# Patient Record
Sex: Male | Born: 1965
Health system: Southern US, Community
[De-identification: ages and names within clinical notes are randomized; demographics above are authoritative.]

## PROBLEM LIST (undated history)

## (undated) DIAGNOSIS — R091 Pleurisy: Secondary | ICD-10-CM

## (undated) DIAGNOSIS — J189 Pneumonia, unspecified organism: Secondary | ICD-10-CM

## (undated) DIAGNOSIS — M419 Scoliosis, unspecified: Secondary | ICD-10-CM

## (undated) DIAGNOSIS — I10 Essential (primary) hypertension: Secondary | ICD-10-CM

## (undated) DIAGNOSIS — K219 Gastro-esophageal reflux disease without esophagitis: Secondary | ICD-10-CM

## (undated) DIAGNOSIS — G4730 Sleep apnea, unspecified: Secondary | ICD-10-CM

## (undated) DIAGNOSIS — Z889 Allergy status to unspecified drugs, medicaments and biological substances status: Secondary | ICD-10-CM

## (undated) DIAGNOSIS — R011 Cardiac murmur, unspecified: Secondary | ICD-10-CM

## (undated) HISTORY — PX: LEG SURGERY: SHX1003

## (undated) HISTORY — PX: BACK SURGERY: SHX140

## (undated) HISTORY — PX: MANDIBLE FRACTURE SURGERY: SHX706

## (undated) HISTORY — PX: HERNIA REPAIR: SHX51

## (undated) HISTORY — PX: NASAL SINUS SURGERY: SHX719

---

## 1998-01-21 ENCOUNTER — Other Ambulatory Visit: Admission: RE | Admit: 1998-01-21 | Discharge: 1998-01-21 | Payer: Self-pay | Admitting: Otolaryngology

## 2001-11-14 ENCOUNTER — Ambulatory Visit (HOSPITAL_BASED_OUTPATIENT_CLINIC_OR_DEPARTMENT_OTHER): Admission: RE | Admit: 2001-11-14 | Discharge: 2001-11-14 | Payer: Self-pay | Admitting: Otolaryngology

## 2002-08-27 ENCOUNTER — Ambulatory Visit (HOSPITAL_COMMUNITY): Admission: RE | Admit: 2002-08-27 | Discharge: 2002-08-27 | Payer: Self-pay | Admitting: Family Medicine

## 2002-08-27 ENCOUNTER — Encounter: Payer: Self-pay | Admitting: Family Medicine

## 2003-04-10 ENCOUNTER — Encounter: Payer: Self-pay | Admitting: Family Medicine

## 2003-04-10 ENCOUNTER — Ambulatory Visit (HOSPITAL_COMMUNITY): Admission: RE | Admit: 2003-04-10 | Discharge: 2003-04-10 | Payer: Self-pay | Admitting: Family Medicine

## 2003-12-01 ENCOUNTER — Emergency Department (HOSPITAL_COMMUNITY): Admission: EM | Admit: 2003-12-01 | Discharge: 2003-12-01 | Payer: Self-pay | Admitting: Emergency Medicine

## 2003-12-09 ENCOUNTER — Ambulatory Visit (HOSPITAL_COMMUNITY): Admission: RE | Admit: 2003-12-09 | Discharge: 2003-12-09 | Payer: Self-pay | Admitting: Orthopedic Surgery

## 2003-12-12 ENCOUNTER — Ambulatory Visit (HOSPITAL_COMMUNITY): Admission: RE | Admit: 2003-12-12 | Discharge: 2003-12-12 | Payer: Self-pay | Admitting: Orthopedic Surgery

## 2004-03-17 ENCOUNTER — Ambulatory Visit (HOSPITAL_COMMUNITY): Admission: RE | Admit: 2004-03-17 | Discharge: 2004-03-17 | Payer: Self-pay | Admitting: Family Medicine

## 2004-09-01 ENCOUNTER — Ambulatory Visit (HOSPITAL_COMMUNITY): Admission: RE | Admit: 2004-09-01 | Discharge: 2004-09-01 | Payer: Self-pay | Admitting: Family Medicine

## 2007-02-15 ENCOUNTER — Ambulatory Visit (HOSPITAL_COMMUNITY): Admission: RE | Admit: 2007-02-15 | Discharge: 2007-02-15 | Payer: Self-pay | Admitting: Family Medicine

## 2007-04-18 ENCOUNTER — Emergency Department (HOSPITAL_COMMUNITY): Admission: EM | Admit: 2007-04-18 | Discharge: 2007-04-18 | Payer: Self-pay | Admitting: Emergency Medicine

## 2010-10-26 ENCOUNTER — Ambulatory Visit (HOSPITAL_COMMUNITY)
Admission: RE | Admit: 2010-10-26 | Discharge: 2010-10-26 | Payer: Self-pay | Source: Home / Self Care | Attending: Psychiatry | Admitting: Psychiatry

## 2010-11-08 ENCOUNTER — Encounter: Payer: Self-pay | Admitting: Family Medicine

## 2010-11-13 ENCOUNTER — Encounter
Admission: RE | Admit: 2010-11-13 | Discharge: 2010-11-13 | Payer: Self-pay | Source: Home / Self Care | Attending: Psychiatry | Admitting: Psychiatry

## 2010-12-31 ENCOUNTER — Emergency Department (HOSPITAL_COMMUNITY)
Admission: EM | Admit: 2010-12-31 | Discharge: 2010-12-31 | Disposition: A | Discharge status: Home / Self Care | Payer: Self-pay | Attending: Emergency Medicine | Admitting: Emergency Medicine

## 2010-12-31 ENCOUNTER — Emergency Department (HOSPITAL_COMMUNITY): Payer: Self-pay

## 2010-12-31 DIAGNOSIS — Y92009 Unspecified place in unspecified non-institutional (private) residence as the place of occurrence of the external cause: Secondary | ICD-10-CM | POA: Insufficient documentation

## 2010-12-31 DIAGNOSIS — S61209A Unspecified open wound of unspecified finger without damage to nail, initial encounter: Secondary | ICD-10-CM | POA: Insufficient documentation

## 2010-12-31 DIAGNOSIS — W298XXA Contact with other powered powered hand tools and household machinery, initial encounter: Secondary | ICD-10-CM | POA: Insufficient documentation
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
## 2011-07-07 ENCOUNTER — Ambulatory Visit (HOSPITAL_COMMUNITY)
Admission: RE | Admit: 2011-07-07 | Discharge: 2011-07-07 | Disposition: A | Discharge status: Home / Self Care | Payer: Self-pay | Source: Ambulatory Visit | Attending: Family Medicine | Admitting: Family Medicine

## 2011-07-07 ENCOUNTER — Other Ambulatory Visit (HOSPITAL_COMMUNITY): Payer: Self-pay | Admitting: Family Medicine

## 2011-07-07 DIAGNOSIS — R112 Nausea with vomiting, unspecified: Secondary | ICD-10-CM | POA: Insufficient documentation

## 2011-07-07 DIAGNOSIS — R109 Unspecified abdominal pain: Secondary | ICD-10-CM

## 2011-07-07 DIAGNOSIS — R9389 Abnormal findings on diagnostic imaging of other specified body structures: Secondary | ICD-10-CM | POA: Insufficient documentation

## 2011-08-03 LAB — POCT CARDIAC MARKERS
Myoglobin, poc: 64.6
Operator id: 215201
Troponin i, poc: <0.05

## 2011-08-03 LAB — CBC
HCT: 42.5
Hemoglobin: 14.9
MCHC: 35.1
RDW: 11.4 — ABNORMAL LOW

## 2011-08-03 LAB — DIFFERENTIAL
Lymphocytes Relative: 28
Lymphs Abs: 2.5
Monocytes Relative: 8
Neutro Abs: 5.4
Neutrophils Relative %: 61

## 2011-08-03 LAB — COMPREHENSIVE METABOLIC PANEL
BUN: 26 — ABNORMAL HIGH
Calcium: 9.5
Glucose, Bld: 121 — ABNORMAL HIGH
Sodium: 139
Total Protein: 7.2

## 2014-09-27 ENCOUNTER — Other Ambulatory Visit (HOSPITAL_COMMUNITY): Payer: Self-pay | Admitting: Internal Medicine

## 2014-09-27 DIAGNOSIS — R110 Nausea: Secondary | ICD-10-CM

## 2014-09-27 DIAGNOSIS — R109 Unspecified abdominal pain: Secondary | ICD-10-CM

## 2014-10-03 ENCOUNTER — Ambulatory Visit (HOSPITAL_COMMUNITY): Payer: Self-pay

## 2016-07-29 DIAGNOSIS — F1120 Opioid dependence, uncomplicated: Secondary | ICD-10-CM | POA: Insufficient documentation

## 2019-01-12 DIAGNOSIS — I10 Essential (primary) hypertension: Secondary | ICD-10-CM | POA: Diagnosis not present

## 2019-01-12 DIAGNOSIS — M419 Scoliosis, unspecified: Secondary | ICD-10-CM | POA: Diagnosis not present

## 2019-01-12 DIAGNOSIS — Z1389 Encounter for screening for other disorder: Secondary | ICD-10-CM | POA: Diagnosis not present

## 2019-01-12 DIAGNOSIS — N451 Epididymitis: Secondary | ICD-10-CM | POA: Diagnosis not present

## 2019-01-12 DIAGNOSIS — Z6829 Body mass index (BMI) 29.0-29.9, adult: Secondary | ICD-10-CM | POA: Diagnosis not present

## 2019-01-12 DIAGNOSIS — M549 Dorsalgia, unspecified: Secondary | ICD-10-CM | POA: Diagnosis not present

## 2020-05-16 DIAGNOSIS — I10 Essential (primary) hypertension: Secondary | ICD-10-CM | POA: Diagnosis not present

## 2020-05-16 DIAGNOSIS — R7309 Other abnormal glucose: Secondary | ICD-10-CM | POA: Diagnosis not present

## 2020-05-16 DIAGNOSIS — Z6830 Body mass index (BMI) 30.0-30.9, adult: Secondary | ICD-10-CM | POA: Diagnosis not present

## 2020-05-16 DIAGNOSIS — K4090 Unilateral inguinal hernia, without obstruction or gangrene, not specified as recurrent: Secondary | ICD-10-CM | POA: Diagnosis not present

## 2020-05-16 DIAGNOSIS — Z0001 Encounter for general adult medical examination with abnormal findings: Secondary | ICD-10-CM | POA: Diagnosis not present

## 2020-05-16 DIAGNOSIS — J45909 Unspecified asthma, uncomplicated: Secondary | ICD-10-CM | POA: Diagnosis not present

## 2020-05-16 DIAGNOSIS — Z1389 Encounter for screening for other disorder: Secondary | ICD-10-CM | POA: Diagnosis not present

## 2020-05-16 DIAGNOSIS — F419 Anxiety disorder, unspecified: Secondary | ICD-10-CM | POA: Diagnosis not present

## 2020-05-16 DIAGNOSIS — M419 Scoliosis, unspecified: Secondary | ICD-10-CM | POA: Diagnosis not present

## 2020-05-16 DIAGNOSIS — E6609 Other obesity due to excess calories: Secondary | ICD-10-CM | POA: Diagnosis not present

## 2020-05-16 DIAGNOSIS — K439 Ventral hernia without obstruction or gangrene: Secondary | ICD-10-CM | POA: Diagnosis not present

## 2020-05-19 ENCOUNTER — Other Ambulatory Visit: Payer: Self-pay | Admitting: Family Medicine

## 2020-05-19 ENCOUNTER — Other Ambulatory Visit (HOSPITAL_COMMUNITY): Payer: Self-pay | Admitting: Family Medicine

## 2020-05-19 DIAGNOSIS — N5089 Other specified disorders of the male genital organs: Secondary | ICD-10-CM

## 2020-06-20 ENCOUNTER — Other Ambulatory Visit: Payer: Self-pay

## 2020-06-20 ENCOUNTER — Ambulatory Visit (HOSPITAL_COMMUNITY)
Admission: RE | Admit: 2020-06-20 | Discharge: 2020-06-20 | Disposition: A | Discharge status: Home / Self Care | Payer: BC Managed Care – PPO | Source: Ambulatory Visit | Attending: Family Medicine | Admitting: Family Medicine

## 2020-06-20 DIAGNOSIS — N433 Hydrocele, unspecified: Secondary | ICD-10-CM | POA: Diagnosis not present

## 2020-06-20 DIAGNOSIS — I861 Scrotal varices: Secondary | ICD-10-CM | POA: Diagnosis not present

## 2020-06-20 DIAGNOSIS — N503 Cyst of epididymis: Secondary | ICD-10-CM | POA: Diagnosis not present

## 2020-06-20 DIAGNOSIS — N5089 Other specified disorders of the male genital organs: Secondary | ICD-10-CM | POA: Diagnosis not present

## 2020-07-04 ENCOUNTER — Other Ambulatory Visit: Payer: Self-pay | Admitting: General Surgery

## 2020-07-04 DIAGNOSIS — K432 Incisional hernia without obstruction or gangrene: Secondary | ICD-10-CM

## 2020-07-30 ENCOUNTER — Ambulatory Visit (HOSPITAL_COMMUNITY)
Admission: RE | Admit: 2020-07-30 | Discharge: 2020-07-30 | Disposition: A | Discharge status: Home / Self Care | Payer: BC Managed Care – PPO | Source: Ambulatory Visit | Attending: General Surgery | Admitting: General Surgery

## 2020-07-30 ENCOUNTER — Other Ambulatory Visit: Payer: Self-pay

## 2020-07-30 DIAGNOSIS — N200 Calculus of kidney: Secondary | ICD-10-CM | POA: Diagnosis not present

## 2020-07-30 DIAGNOSIS — K432 Incisional hernia without obstruction or gangrene: Secondary | ICD-10-CM | POA: Insufficient documentation

## 2020-07-30 DIAGNOSIS — S3991XA Unspecified injury of abdomen, initial encounter: Secondary | ICD-10-CM | POA: Diagnosis not present

## 2020-07-30 DIAGNOSIS — K439 Ventral hernia without obstruction or gangrene: Secondary | ICD-10-CM | POA: Diagnosis not present

## 2020-07-30 DIAGNOSIS — K8020 Calculus of gallbladder without cholecystitis without obstruction: Secondary | ICD-10-CM | POA: Diagnosis not present

## 2020-08-01 ENCOUNTER — Ambulatory Visit: Payer: Self-pay | Admitting: General Surgery

## 2020-08-01 DIAGNOSIS — K432 Incisional hernia without obstruction or gangrene: Secondary | ICD-10-CM | POA: Diagnosis not present

## 2020-08-01 NOTE — H&P (Signed)
History of Present Illness Axel Filler MD; 08/01/2020 11:11 AM) The patient is a 54 year old male who presents with an incisional hernia. Patient follow back up after CT scan. CT scan does reveal a 4.6 x 2.3 cm ventral hernia. There appears to be significant has of omentum within the hernia sac itself. Patient states he's had continued burning, stabbing sensation that does radiate to the back for the hernia. I did review the CT scan personally and discussed the CT scan in detail with the patient.     -----------------------------------------  Referred by: Dr. Phillips Odor Chief Complaint: Recurrent ventral hernia  Patient is a 54 year old male with a history of scoliosis with multiple back surgeries, history of hypertension, and a history of multiple recurrent incisional hernias that were fixed in 2000 and 2005. Patient states he is unsure whether or not mesh was used with these hernias.  He's had previous tendon placement in an X fashion over his abdominal wall to help with support his back. This was done in the past.  Patient states that he noticed that the hernia recurred about 2 months ago. He states that he felt that he repeat for this several weeks ago. He states that he was lifting furniture on the initial occurrence.  He has had no signs or symptoms of incarceration or granulation. He does notice some increased gassy symptoms, but is currently on antibiotics for likely epididymitis.    Allergies (Tanisha A. Manson Passey, RMA; 08/01/2020 11:01 AM) Penicillins  Anaphylaxis, Swelling. Allergies Reconciled   Medication History (Tanisha A. Brown, RMA; 08/01/2020 11:01 AM) Robaxin-750 (750MG  Tablet, 1 (one) Oral qhs, Taken starting 07/04/2020) Active. oxyCODONE-Acetaminophen (10-325MG  Tablet, Oral) Active. NexIUM (40MG  Capsule DR, Oral) Active. Allegra (30MG  Tablet, Oral) Active. Albuterol Sulfate HFA (108 (90 Base)MCG/ACT Aerosol Soln, Inhalation) Active. Medications  Reconciled    Review of Systems 07/06/2020, MD; 08/01/2020 11:13 AM) General Not Present- Appetite Loss, Chills, Fatigue, Fever, Night Sweats, Weight Gain and Weight Loss. Skin Not Present- Change in Wart/Mole, Dryness, Hives, Jaundice, New Lesions, Non-Healing Wounds, Rash and Ulcer. HEENT Present- Ringing in the Ears, Seasonal Allergies, Sinus Pain and Wears glasses/contact lenses. Not Present- Earache, Hearing Loss, Hoarseness, Nose Bleed, Oral Ulcers, Sore Throat, Visual Disturbances and Yellow Eyes. Respiratory Present- Snoring. Not Present- Bloody sputum, Chronic Cough, Difficulty Breathing and Wheezing. Breast Not Present- Breast Mass, Breast Pain, Nipple Discharge and Skin Changes. Cardiovascular Present- Leg Cramps. Not Present- Chest Pain, Difficulty Breathing Lying Down, Palpitations, Rapid Heart Rate, Shortness of Breath and Swelling of Extremities. Gastrointestinal Present- Abdominal Pain, Constipation and Indigestion. Not Present- Bloating, Bloody Stool, Change in Bowel Habits, Chronic diarrhea, Difficulty Swallowing, Excessive gas, Gets full quickly at meals, Hemorrhoids, Nausea, Rectal Pain and Vomiting. Male Genitourinary Present- Change in Urinary Stream and Nocturia. Not Present- Blood in Urine, Frequency, Impotence, Painful Urination, Urgency and Urine Leakage. Musculoskeletal Present- Back Pain. Not Present- Joint Pain, Joint Stiffness, Muscle Pain, Muscle Weakness and Swelling of Extremities. Neurological Not Present- Decreased Memory, Fainting, Headaches, Numbness, Seizures, Tingling, Tremor, Trouble walking and Weakness. Psychiatric Not Present- Anxiety, Bipolar, Change in Sleep Pattern, Depression, Fearful and Frequent crying. Endocrine Not Present- Cold Intolerance, Excessive Hunger, Hair Changes, Heat Intolerance and New Diabetes. Hematology Not Present- Blood Thinners, Easy Bruising, Excessive bleeding, Gland problems, HIV and Persistent Infections. All other  systems negative  Vitals (Tanisha A. Brown RMA; 08/01/2020 11:01 AM) 08/01/2020 11:01 AM Weight: 191.8 lb Height: 65in Body Surface Area: 1.94 m Body Mass Index: 31.92 kg/m  Temp.: 97.20F  Pulse:  83 (Regular)  BP: 134/82(Sitting, Left Arm, Standard)       Physical Exam Axel Filler, MD; 08/01/2020 11:13 AM) The physical exam findings are as follows: Note: Constitutional: No acute distress, conversant, appears stated age  Eyes: Anicteric sclerae, moist conjunctiva, no lid lag  Neck: No thyromegaly, trachea midline, no cervical lymphadenopathy  Lungs: Clear to auscultation biilaterally, normal respiratory effot  Cardiovascular: regular rate & rhythm, no murmurs, no peripheal edema, pedal pulses 2+  GI: Soft, no masses or hepatosplenomegaly, non-tender to palpation  MSK: Normal gait, no clubbing cyanosis, edema  Skin: No rashes, palpation reveals normal skin turgor  Psychiatric: Appropriate judgment and insight, oriented to person, place, and time  Abdomen Inspection Hernias - Incisional - Reducible (Recurrent incisional hernia in the upper midline. Multiple incisions in the left upper left lower lateral quadrant, right upper right lower lateral quadrant. Patient has an incision just over the umbilicus in a transverse fashion.) .    Assessment & Plan Axel Filler MD; 08/01/2020 11:12 AM) RECURRENT INCISIONAL HERNIA (K43.2) Impression: 54 year old male with recurrent incisional hernia, this is complicated with his history of previous hernia repair tests will scoliosis and surgeries for scoliosis. 1. The patient will like to proceed to the operating room for laparoscopic ventral hernia repair with mesh.  2. I discussed with the patient the signs and symptoms of incarceration and strangulation and the need to proceed to the ER should they occur.  3. I discussed with the patient the risks and benefits of the procedure to include but not limited to:  Infection, bleeding, damage to surrounding structures, possible need for further surgery, possible nerve pain, and possible recurrence. The patient was understanding and wishes to proceed.   I reviewed the patient's external notes from the referring physicians as well as consulting physician team. Each of the radiologic studies and lab studies were independently reviewed and interpreted. I discussed the results of the above studies and how they relate to the patient's surgical problems.

## 2020-09-29 ENCOUNTER — Inpatient Hospital Stay (HOSPITAL_COMMUNITY)
Admission: RE | Admit: 2020-09-29 | Discharge: 2020-09-29 | Disposition: A | Discharge status: Home / Self Care | Payer: BC Managed Care – PPO | Source: Ambulatory Visit

## 2020-09-29 ENCOUNTER — Other Ambulatory Visit (HOSPITAL_COMMUNITY): Payer: BC Managed Care – PPO

## 2020-09-29 NOTE — Progress Notes (Signed)
Your procedure is scheduled on Thursday October 02, 2020.  Report to [facility] Main Entrance "A" at 08:45 A.M., and check in at the Admitting office.  Call this number if you have problems the morning of surgery: [phone]  Call [phone] if you have any questions prior to your surgery date Monday-Friday 8am-4pm   Remember: Do not eat after midnight the night before your surgery  You may drink clear liquids until 07:45 A.M. the morning of your surgery.   Clear liquids allowed are: Water, Non-Citrus Juices (without pulp), Carbonated Beverages, Clear Tea, Black Coffee Only, and Gatorade   Take these medicines the morning of surgery with A SIP OF WATER: esomeprazole (NEXIUM)  montelukast (SINGULAIR)    If needed: albuterol (VENTOLIN HFA) --- Please bring all inhalers with you the day of surgery.  eye drops   As of today, STOP taking any Aspirin (unless otherwise instructed by your surgeon), Aleve, Naproxen, Ibuprofen, Motrin, Advil, Goody's, BC's, all herbal medications, fish oil, and all vitamins.    The Morning of Surgery  Do not wear jewelr  Do not wear lotions, powders, colognes, or deodorant Men may shave face and neck.  Do not bring valuables to the hospital.  [facility] is not responsible for any belongings or valuables.  If you are a smoker, DO NOT Smoke 24 hours prior to surgery  If you wear a CPAP at night please bring your mask the morning of surgery   Remember that you must have someone to transport you home after your surgery, and remain with you for 24 hours if you are discharged the same day.   Please bring cases for contacts, glasses, hearing aids, dentures or bridgework because it cannot be worn into surgery.    Leave your suitcase in the car.  After surgery it may be brought to your room.  For patients admitted to the hospital, discharge time will be determined by your treatment team.  Patients discharged the day of surgery will not be  allowed to drive home.    Special instructions:   [facility]- Preparing For Surgery  Before surgery, you can play an important role. Because skin is not sterile, your skin needs to be as free of germs as possible. You can reduce the number of germs on your skin by washing with CHG (chlorahexidine gluconate) Soap before surgery.  CHG is an antiseptic cleaner which kills germs and bonds with the skin to continue killing germs even after washing.    Oral Hygiene is also important to reduce your risk of infection.  Remember - BRUSH YOUR TEETH THE MORNING OF SURGERY WITH YOUR REGULAR TOOTHPASTE  Please do not use if you have an allergy to CHG or antibacterial soaps. If your skin becomes reddened/irritated stop using the CHG.  Do not shave (including legs and underarms) for at least 48 hours prior to first CHG shower. It is OK to shave your face.  Please follow these instructions carefully.   1. Shower the NIGHT BEFORE SURGERY and the MORNING OF SURGERY with CHG Soap.   2. If you chose to wash your hair and body, wash as usual with your normal shampoo and body-wash/soap.  3. Rinse your hair and body thoroughly to remove the shampoo and soap.  4. Apply CHG directly to the skin (ONLY FROM THE NECK DOWN) and wash gently with a scrungie or a clean washcloth.   5. Do not use on open wounds or open sores. Avoid contact with your eyes,  ears, mouth and genitals (private parts). Wash Face and genitals (private parts)  with your normal soap.   6. Wash thoroughly, paying special attention to the area where your surgery will be performed.  7. Thoroughly rinse your body with warm water from the neck down.  8. DO NOT shower/wash with your normal soap after using and rinsing off the CHG Soap.  9. Pat yourself dry with a CLEAN TOWEL.  10. Wear CLEAN PAJAMAS to bed the night before surgery  11. Place CLEAN SHEETS on your bed the night of your first shower and DO NOT SLEEP WITH PETS.  12. Wear  comfortable clothes the morning of surgery.     Day of Surgery:  Please shower the morning of surgery with the CHG soap Do not apply any deodorants/lotions. Please wear clean clothes to the hospital/surgery center.   Remember to brush your teeth WITH YOUR REGULAR TOOTHPASTE.   Please read over the following fact sheets that you were given.

## 2020-09-30 ENCOUNTER — Other Ambulatory Visit: Payer: Self-pay

## 2020-09-30 ENCOUNTER — Encounter (HOSPITAL_COMMUNITY): Payer: Self-pay

## 2020-09-30 ENCOUNTER — Other Ambulatory Visit (HOSPITAL_COMMUNITY)
Admission: RE | Admit: 2020-09-30 | Discharge: 2020-09-30 | Disposition: A | Discharge status: Home / Self Care | Payer: BC Managed Care – PPO | Source: Ambulatory Visit | Attending: General Surgery | Admitting: General Surgery

## 2020-09-30 ENCOUNTER — Encounter (HOSPITAL_COMMUNITY)
Admission: RE | Admit: 2020-09-30 | Discharge: 2020-09-30 | Disposition: A | Discharge status: Home / Self Care | Payer: BC Managed Care – PPO | Source: Ambulatory Visit | Attending: General Surgery | Admitting: General Surgery

## 2020-09-30 DIAGNOSIS — Z01812 Encounter for preprocedural laboratory examination: Secondary | ICD-10-CM | POA: Insufficient documentation

## 2020-09-30 DIAGNOSIS — Z01818 Encounter for other preprocedural examination: Secondary | ICD-10-CM | POA: Diagnosis not present

## 2020-09-30 DIAGNOSIS — Z20822 Contact with and (suspected) exposure to covid-19: Secondary | ICD-10-CM | POA: Diagnosis not present

## 2020-09-30 HISTORY — DX: Scoliosis, unspecified: M41.9

## 2020-09-30 HISTORY — DX: Allergy status to unspecified drugs, medicaments and biological substances: Z88.9

## 2020-09-30 HISTORY — DX: Sleep apnea, unspecified: G47.30

## 2020-09-30 HISTORY — DX: Pleurisy: R09.1

## 2020-09-30 HISTORY — DX: Essential (primary) hypertension: I10

## 2020-09-30 HISTORY — DX: Gastro-esophageal reflux disease without esophagitis: K21.9

## 2020-09-30 HISTORY — DX: Pneumonia, unspecified organism: J18.9

## 2020-09-30 HISTORY — DX: Cardiac murmur, unspecified: R01.1

## 2020-09-30 LAB — CBC
HCT: 49.9 % (ref 39.0–52.0)
Hemoglobin: 16 g/dL (ref 13.0–17.0)
MCH: 31.4 pg (ref 26.0–34.0)
MCHC: 32.1 g/dL (ref 30.0–36.0)
MCV: 97.8 fL (ref 80.0–100.0)
Platelets: 274 10*3/uL (ref 150–400)
RBC: 5.1 MIL/uL (ref 4.22–5.81)
RDW: 12.3 % (ref 11.5–15.5)
WBC: 5.5 10*3/uL (ref 4.0–10.5)
nRBC: 0 % (ref 0.0–0.2)

## 2020-09-30 LAB — BASIC METABOLIC PANEL
Anion gap: 8 (ref 5–15)
BUN: 12 mg/dL (ref 6–20)
CO2: 32 mmol/L (ref 22–32)
Calcium: 9.3 mg/dL (ref 8.9–10.3)
Chloride: 99 mmol/L (ref 98–111)
Creatinine, Ser: 1.21 mg/dL (ref 0.61–1.24)
GFR, Estimated: >60 mL/min (ref >60)
Glucose, Bld: 94 mg/dL (ref 70–99)
Potassium: 4.9 mmol/L (ref 3.5–5.1)
Sodium: 139 mmol/L (ref 135–145)

## 2020-09-30 LAB — SARS CORONAVIRUS 2 (TAT 6-24 HRS): SARS Coronavirus 2: NEGATIVE

## 2020-09-30 NOTE — Progress Notes (Signed)
PCP - Dr. Karleen Hampshire Cardiologist -   PPM/ICD -  Device Orders -  Rep Notified -   Chest x-ray -  EKG - 09/30/2020 Stress Test - patient states it was more than 10 years ago, thinks it was at [facility] but is unsure - states it was stress related and all cleared up after leaving his job at the time  ECHO - patient denies Cardiac Cath - patient denies  Sleep Study - patient states it was 10+ years ago, "a long time ago", not sure where CPAP - tried CPAP but it did not help, instead uses a mouth guard.  Fasting Blood Sugar - n/a Checks Blood Sugar _____ times a day  Blood Thinner Instructions: n/a Aspirin Instructions: n/a  ERAS Protcol - clears until 0745 PRE-SURGERY Ensure or G2- none ordered  COVID TEST- after PAT appointment   Anesthesia review: n/a  Patient denies shortness of breath, fever, cough and chest pain at PAT appointment   All instructions explained to the patient, with a verbal understanding of the material. Patient agrees to go over the instructions while at home for a better understanding. Patient also instructed to self quarantine after being tested for COVID-19. The opportunity to ask questions was provided.

## 2020-10-02 ENCOUNTER — Encounter (HOSPITAL_COMMUNITY)
Admission: RE | Disposition: A | Discharge status: Home / Self Care | Payer: Self-pay | Source: Home / Self Care | Attending: General Surgery

## 2020-10-02 ENCOUNTER — Ambulatory Visit (HOSPITAL_COMMUNITY): Payer: BC Managed Care – PPO | Admitting: Anesthesiology

## 2020-10-02 ENCOUNTER — Observation Stay (HOSPITAL_COMMUNITY)
Admission: RE | Admit: 2020-10-02 | Discharge: 2020-10-03 | Disposition: A | Discharge status: Home / Self Care | Payer: BC Managed Care – PPO | Attending: General Surgery | Admitting: General Surgery

## 2020-10-02 ENCOUNTER — Other Ambulatory Visit: Payer: Self-pay

## 2020-10-02 ENCOUNTER — Encounter (HOSPITAL_COMMUNITY): Payer: Self-pay | Admitting: General Surgery

## 2020-10-02 DIAGNOSIS — K432 Incisional hernia without obstruction or gangrene: Principal | ICD-10-CM | POA: Insufficient documentation

## 2020-10-02 DIAGNOSIS — I10 Essential (primary) hypertension: Secondary | ICD-10-CM | POA: Diagnosis not present

## 2020-10-02 DIAGNOSIS — Z8719 Personal history of other diseases of the digestive system: Secondary | ICD-10-CM

## 2020-10-02 DIAGNOSIS — K219 Gastro-esophageal reflux disease without esophagitis: Secondary | ICD-10-CM | POA: Diagnosis not present

## 2020-10-02 DIAGNOSIS — G4730 Sleep apnea, unspecified: Secondary | ICD-10-CM | POA: Diagnosis not present

## 2020-10-02 DIAGNOSIS — K439 Ventral hernia without obstruction or gangrene: Secondary | ICD-10-CM | POA: Diagnosis not present

## 2020-10-02 HISTORY — PX: INSERTION OF MESH: SHX5868

## 2020-10-02 HISTORY — PX: VENTRAL HERNIA REPAIR: SHX424

## 2020-10-02 LAB — CREATININE, SERUM
Creatinine, Ser: 1.28 mg/dL — ABNORMAL HIGH (ref 0.61–1.24)
GFR, Estimated: >60 mL/min (ref >60)

## 2020-10-02 SURGERY — REPAIR, HERNIA, VENTRAL, LAPAROSCOPIC
Anesthesia: General | Site: Abdomen

## 2020-10-02 MED ORDER — 0.9 % SODIUM CHLORIDE (POUR BTL) OPTIME
TOPICAL | Status: DC | PRN
  Administered 2020-10-02: 10:00:00 1000 mL

## 2020-10-02 MED ORDER — MIDAZOLAM HCL 2 MG/2ML IJ SOLN: 1.0000 mg | Freq: Once | INTRAMUSCULAR | Status: AC

## 2020-10-02 MED ORDER — DIAZEPAM 5 MG PO TABS
ORAL_TABLET | ORAL | Status: AC
  Administered 2020-10-02: 11:00:00 5 mg via ORAL
  Filled 2020-10-02: qty 1

## 2020-10-02 MED ORDER — FENTANYL CITRATE (PF) 100 MCG/2ML IJ SOLN
INTRAMUSCULAR | Status: AC
  Administered 2020-10-02: 12:00:00 50 ug via INTRAVENOUS
  Filled 2020-10-02: qty 2

## 2020-10-02 MED ORDER — MIDAZOLAM HCL 2 MG/2ML IJ SOLN
INTRAMUSCULAR | Status: AC
  Filled 2020-10-02: qty 2

## 2020-10-02 MED ORDER — ROCURONIUM BROMIDE 10 MG/ML (PF) SYRINGE
PREFILLED_SYRINGE | INTRAVENOUS | Status: AC
  Filled 2020-10-02: qty 10

## 2020-10-02 MED ORDER — PANTOPRAZOLE SODIUM 40 MG PO TBEC
40.0000 mg | DELAYED_RELEASE_TABLET | Freq: Every day | ORAL | Status: DC
  Administered 2020-10-03: 09:00:00 40 mg via ORAL
  Filled 2020-10-02: qty 1

## 2020-10-02 MED ORDER — TETRAHYDROZOLINE HCL 0.05 % OP SOLN
2.0000 [drp] | Freq: Every day | OPHTHALMIC | Status: DC | PRN
  Filled 2020-10-02: qty 15

## 2020-10-02 MED ORDER — SUGAMMADEX SODIUM 200 MG/2ML IV SOLN
INTRAVENOUS | Status: DC | PRN
  Administered 2020-10-02: 200 mg via INTRAVENOUS

## 2020-10-02 MED ORDER — MIDAZOLAM HCL 2 MG/2ML IJ SOLN
1.0000 mg | Freq: Once | INTRAMUSCULAR | Status: AC
  Administered 2020-10-02: 11:00:00 1 mg via INTRAVENOUS

## 2020-10-02 MED ORDER — ENOXAPARIN SODIUM 40 MG/0.4ML ~~LOC~~ SOLN
40.0000 mg | SUBCUTANEOUS | Status: DC
  Administered 2020-10-03: 08:00:00 40 mg via SUBCUTANEOUS
  Filled 2020-10-02: qty 0.4

## 2020-10-02 MED ORDER — OXYMETAZOLINE HCL 0.05 % NA SOLN
1.0000 | Freq: Two times a day (BID) | NASAL | Status: DC | PRN
  Filled 2020-10-02 (×2): qty 30

## 2020-10-02 MED ORDER — ALBUTEROL SULFATE HFA 108 (90 BASE) MCG/ACT IN AERS: 2.0000 | INHALATION_SPRAY | Freq: Four times a day (QID) | RESPIRATORY_TRACT | Status: DC | PRN

## 2020-10-02 MED ORDER — ONDANSETRON HCL 4 MG/2ML IJ SOLN: 4.0000 mg | Freq: Four times a day (QID) | INTRAMUSCULAR | Status: DC | PRN

## 2020-10-02 MED ORDER — LIDOCAINE 2% (20 MG/ML) 5 ML SYRINGE
INTRAMUSCULAR | Status: AC
  Filled 2020-10-02: qty 5

## 2020-10-02 MED ORDER — FENTANYL CITRATE (PF) 250 MCG/5ML IJ SOLN
INTRAMUSCULAR | Status: DC | PRN
  Administered 2020-10-02 (×3): 50 ug via INTRAVENOUS
  Administered 2020-10-02: 150 ug via INTRAVENOUS
  Administered 2020-10-02 (×2): 50 ug via INTRAVENOUS

## 2020-10-02 MED ORDER — MIDAZOLAM HCL 2 MG/2ML IJ SOLN
INTRAMUSCULAR | Status: AC
  Administered 2020-10-02: 12:00:00 1 mg via INTRAVENOUS
  Filled 2020-10-02: qty 2

## 2020-10-02 MED ORDER — OXYCODONE HCL 5 MG PO TABS
10.0000 mg | ORAL_TABLET | ORAL | Status: DC | PRN
  Administered 2020-10-02 – 2020-10-03 (×3): 10 mg via ORAL
  Filled 2020-10-02 (×3): qty 2

## 2020-10-02 MED ORDER — ROCURONIUM BROMIDE 10 MG/ML (PF) SYRINGE
PREFILLED_SYRINGE | INTRAVENOUS | Status: DC | PRN
  Administered 2020-10-02: 60 mg via INTRAVENOUS

## 2020-10-02 MED ORDER — ONDANSETRON 4 MG PO TBDP: 4.0000 mg | ORAL_TABLET | Freq: Four times a day (QID) | ORAL | Status: DC | PRN

## 2020-10-02 MED ORDER — ORAL CARE MOUTH RINSE: 15.0000 mL | Freq: Once | OROMUCOSAL | Status: AC

## 2020-10-02 MED ORDER — FENTANYL CITRATE (PF) 250 MCG/5ML IJ SOLN
INTRAMUSCULAR | Status: AC
  Filled 2020-10-02: qty 5

## 2020-10-02 MED ORDER — FENTANYL CITRATE (PF) 100 MCG/2ML IJ SOLN
INTRAMUSCULAR | Status: AC
  Administered 2020-10-02: 11:00:00 50 ug via INTRAVENOUS
  Filled 2020-10-02: qty 2

## 2020-10-02 MED ORDER — PROMETHAZINE HCL 25 MG/ML IJ SOLN: 6.2500 mg | INTRAMUSCULAR | Status: DC | PRN

## 2020-10-02 MED ORDER — OXYCODONE HCL 5 MG PO TABS
ORAL_TABLET | ORAL | Status: AC
  Administered 2020-10-02: 15:00:00 10 mg via ORAL
  Filled 2020-10-02: qty 2

## 2020-10-02 MED ORDER — ONDANSETRON HCL 4 MG/2ML IJ SOLN
INTRAMUSCULAR | Status: DC | PRN
  Administered 2020-10-02: 4 mg via INTRAVENOUS

## 2020-10-02 MED ORDER — PROPOFOL 10 MG/ML IV BOLUS
INTRAVENOUS | Status: AC
  Filled 2020-10-02: qty 20

## 2020-10-02 MED ORDER — CHLORHEXIDINE GLUCONATE 0.12 % MT SOLN: 15.0000 mL | Freq: Once | OROMUCOSAL | Status: AC

## 2020-10-02 MED ORDER — LACTATED RINGERS IV SOLN: INTRAVENOUS | Status: DC

## 2020-10-02 MED ORDER — LIDOCAINE 2% (20 MG/ML) 5 ML SYRINGE
INTRAMUSCULAR | Status: DC | PRN
  Administered 2020-10-02: 40 mg via INTRAVENOUS

## 2020-10-02 MED ORDER — DIAZEPAM 5 MG PO TABS
5.0000 mg | ORAL_TABLET | Freq: Four times a day (QID) | ORAL | Status: DC | PRN
  Administered 2020-10-02 – 2020-10-03 (×3): 5 mg via ORAL
  Filled 2020-10-02 (×3): qty 1

## 2020-10-02 MED ORDER — CHLORHEXIDINE GLUCONATE CLOTH 2 % EX PADS: 6.0000 | MEDICATED_PAD | Freq: Once | CUTANEOUS | Status: DC

## 2020-10-02 MED ORDER — ONDANSETRON HCL 4 MG/2ML IJ SOLN
INTRAMUSCULAR | Status: AC
  Filled 2020-10-02: qty 2

## 2020-10-02 MED ORDER — DIAZEPAM 5 MG PO TABS: 5.0000 mg | ORAL_TABLET | Freq: Once | ORAL | Status: AC

## 2020-10-02 MED ORDER — PROPOFOL 10 MG/ML IV BOLUS
INTRAVENOUS | Status: DC | PRN
  Administered 2020-10-02: 200 mg via INTRAVENOUS

## 2020-10-02 MED ORDER — DEXMEDETOMIDINE (PRECEDEX) IN NS 20 MCG/5ML (4 MCG/ML) IV SYRINGE
PREFILLED_SYRINGE | INTRAVENOUS | Status: DC | PRN
  Administered 2020-10-02: 8 ug via INTRAVENOUS
  Administered 2020-10-02: 12 ug via INTRAVENOUS

## 2020-10-02 MED ORDER — DEXAMETHASONE SODIUM PHOSPHATE 10 MG/ML IJ SOLN
INTRAMUSCULAR | Status: DC | PRN
  Administered 2020-10-02: 10 mg via INTRAVENOUS

## 2020-10-02 MED ORDER — HYDROMORPHONE HCL 1 MG/ML IJ SOLN: 0.5000 mg | Freq: Once | INTRAMUSCULAR | Status: AC

## 2020-10-02 MED ORDER — DEXAMETHASONE SODIUM PHOSPHATE 10 MG/ML IJ SOLN
INTRAMUSCULAR | Status: AC
  Filled 2020-10-02: qty 1

## 2020-10-02 MED ORDER — CHLORHEXIDINE GLUCONATE 0.12 % MT SOLN
OROMUCOSAL | Status: AC
  Administered 2020-10-02: 09:00:00 15 mL via OROMUCOSAL
  Filled 2020-10-02: qty 15

## 2020-10-02 MED ORDER — VANCOMYCIN HCL IN DEXTROSE 1-5 GM/200ML-% IV SOLN
1000.0000 mg | Freq: Once | INTRAVENOUS | Status: AC
  Administered 2020-10-02: 10:00:00 1000 mg via INTRAVENOUS
  Filled 2020-10-02: qty 200

## 2020-10-02 MED ORDER — ACETAMINOPHEN 500 MG PO TABS
ORAL_TABLET | ORAL | Status: AC
  Administered 2020-10-02: 09:00:00 1000 mg via ORAL
  Filled 2020-10-02: qty 2

## 2020-10-02 MED ORDER — TRAMADOL HCL 50 MG PO TABS: 50.0000 mg | ORAL_TABLET | Freq: Four times a day (QID) | ORAL | 0 refills | Status: AC | PRN

## 2020-10-02 MED ORDER — DIAZEPAM 5 MG PO TABS
ORAL_TABLET | ORAL | Status: AC
  Administered 2020-10-03: 11:00:00 5 mg via ORAL
  Filled 2020-10-02: qty 1

## 2020-10-02 MED ORDER — METHOCARBAMOL 1000 MG/10ML IJ SOLN
500.0000 mg | INTRAVENOUS | Status: AC
  Administered 2020-10-02: 13:00:00 500 mg via INTRAVENOUS
  Filled 2020-10-02 (×2): qty 5

## 2020-10-02 MED ORDER — MONTELUKAST SODIUM 10 MG PO TABS
10.0000 mg | ORAL_TABLET | Freq: Every day | ORAL | Status: DC
  Administered 2020-10-03: 09:00:00 10 mg via ORAL
  Filled 2020-10-02: qty 1

## 2020-10-02 MED ORDER — BUPIVACAINE HCL 0.25 % IJ SOLN
INTRAMUSCULAR | Status: DC | PRN
  Administered 2020-10-02: 6 mL

## 2020-10-02 MED ORDER — MIDAZOLAM HCL 5 MG/5ML IJ SOLN
INTRAMUSCULAR | Status: DC | PRN
  Administered 2020-10-02: 2 mg via INTRAVENOUS

## 2020-10-02 MED ORDER — HYDROMORPHONE HCL 1 MG/ML IJ SOLN
INTRAMUSCULAR | Status: AC
  Administered 2020-10-02: 12:00:00 0.5 mg via INTRAVENOUS
  Filled 2020-10-02: qty 1

## 2020-10-02 MED ORDER — DIAZEPAM 5 MG PO TABS: 5.0000 mg | ORAL_TABLET | Freq: Four times a day (QID) | ORAL | Status: DC | PRN

## 2020-10-02 MED ORDER — BUPIVACAINE HCL (PF) 0.25 % IJ SOLN
INTRAMUSCULAR | Status: AC
  Filled 2020-10-02: qty 30

## 2020-10-02 MED ORDER — VANCOMYCIN HCL 1000 MG IV SOLR: INTRAVENOUS | Status: DC | PRN

## 2020-10-02 MED ORDER — LISINOPRIL 20 MG PO TABS
20.0000 mg | ORAL_TABLET | Freq: Every day | ORAL | Status: DC
  Administered 2020-10-03: 09:00:00 20 mg via ORAL
  Filled 2020-10-02: qty 1

## 2020-10-02 MED ORDER — ADULT MULTIVITAMIN W/MINERALS CH
1.0000 | ORAL_TABLET | Freq: Every day | ORAL | Status: DC
  Administered 2020-10-03: 09:00:00 1 via ORAL
  Filled 2020-10-02: qty 1

## 2020-10-02 MED ORDER — FENTANYL CITRATE (PF) 100 MCG/2ML IJ SOLN
25.0000 ug | INTRAMUSCULAR | Status: DC | PRN
  Administered 2020-10-02: 50 ug via INTRAVENOUS

## 2020-10-02 MED ORDER — GABAPENTIN 300 MG PO CAPS: 300.0000 mg | ORAL_CAPSULE | ORAL | Status: AC

## 2020-10-02 MED ORDER — HYDROMORPHONE HCL 1 MG/ML IJ SOLN
INTRAMUSCULAR | Status: AC
  Administered 2020-10-02: 13:00:00 0.5 mg via INTRAVENOUS
  Filled 2020-10-02: qty 1

## 2020-10-02 MED ORDER — GUAIFENESIN ER 600 MG PO TB12
1200.0000 mg | ORAL_TABLET | Freq: Three times a day (TID) | ORAL | Status: DC | PRN
  Administered 2020-10-02: 23:00:00 1200 mg via ORAL
  Filled 2020-10-02: qty 2

## 2020-10-02 MED ORDER — ACETAMINOPHEN 500 MG PO TABS: 1000.0000 mg | ORAL_TABLET | ORAL | Status: AC

## 2020-10-02 MED ORDER — GABAPENTIN 300 MG PO CAPS
ORAL_CAPSULE | ORAL | Status: AC
  Administered 2020-10-02: 09:00:00 300 mg via ORAL
  Filled 2020-10-02: qty 1

## 2020-10-02 SURGICAL SUPPLY — 43 items
APPLIER CLIP 5 13 M/L LIGAMAX5 (MISCELLANEOUS)
CANISTER SUCT 3000ML PPV (MISCELLANEOUS) IMPLANT
CHLORAPREP W/TINT 26 (MISCELLANEOUS) ×3 IMPLANT
CLIP APPLIE 5 13 M/L LIGAMAX5 (MISCELLANEOUS) IMPLANT
COVER SURGICAL LIGHT HANDLE (MISCELLANEOUS) ×3 IMPLANT
COVER WAND RF STERILE (DRAPES) IMPLANT
DERMABOND ADVANCED (GAUZE/BANDAGES/DRESSINGS) ×2
DERMABOND ADVANCED .7 DNX12 (GAUZE/BANDAGES/DRESSINGS) ×1 IMPLANT
DEVICE SECURE STRAP 25 ABSORB (INSTRUMENTS) ×6 IMPLANT
DEVICE TROCAR PUNCTURE CLOSURE (ENDOMECHANICALS) ×3 IMPLANT
ELECT REM PT RETURN 9FT ADLT (ELECTROSURGICAL) ×3
ELECTRODE REM PT RTRN 9FT ADLT (ELECTROSURGICAL) ×1 IMPLANT
GLOVE BIO SURGEON STRL SZ7.5 (GLOVE) ×3 IMPLANT
GOWN STRL REUS W/ TWL LRG LVL3 (GOWN DISPOSABLE) ×2 IMPLANT
GOWN STRL REUS W/ TWL XL LVL3 (GOWN DISPOSABLE) ×1 IMPLANT
GOWN STRL REUS W/TWL LRG LVL3 (GOWN DISPOSABLE) ×6
GOWN STRL REUS W/TWL XL LVL3 (GOWN DISPOSABLE) ×3
KIT BASIN OR (CUSTOM PROCEDURE TRAY) ×3 IMPLANT
KIT TURNOVER KIT B (KITS) ×3 IMPLANT
MESH VENTRALIGHT ST 6X8 (Mesh Specialty) ×3 IMPLANT
MESH VENTRLGHT ELLIPSE 8X6XMFL (Mesh Specialty) ×1 IMPLANT
NEEDLE INSUFFLATION 14GA 120MM (NEEDLE) ×3 IMPLANT
NS IRRIG 1000ML POUR BTL (IV SOLUTION) ×3 IMPLANT
PAD ARMBOARD 7.5X6 YLW CONV (MISCELLANEOUS) ×6 IMPLANT
POUCH LAPAROSCOPIC INSTRUMENT (MISCELLANEOUS) ×3 IMPLANT
SCISSORS LAP 5X35 DISP (ENDOMECHANICALS) ×3 IMPLANT
SET IRRIG TUBING LAPAROSCOPIC (IRRIGATION / IRRIGATOR) IMPLANT
SET TUBE SMOKE EVAC HIGH FLOW (TUBING) ×3 IMPLANT
SHEARS HARMONIC ACE PLUS 36CM (ENDOMECHANICALS) IMPLANT
SLEEVE ENDOPATH XCEL 5M (ENDOMECHANICALS) ×6 IMPLANT
SUT CHROMIC 2 0 SH (SUTURE) ×3 IMPLANT
SUT MNCRL AB 4-0 PS2 18 (SUTURE) ×3 IMPLANT
SUT NOVA 1 T20/GS 25DT (SUTURE) ×3 IMPLANT
SUT PROLENE 2 0 KS (SUTURE) ×6 IMPLANT
TOWEL GREEN STERILE (TOWEL DISPOSABLE) ×3 IMPLANT
TOWEL GREEN STERILE FF (TOWEL DISPOSABLE) ×3 IMPLANT
TRAY FOLEY W/BAG SLVR 16FR (SET/KITS/TRAYS/PACK)
TRAY FOLEY W/BAG SLVR 16FR ST (SET/KITS/TRAYS/PACK) IMPLANT
TRAY LAPAROSCOPIC MC (CUSTOM PROCEDURE TRAY) ×3 IMPLANT
TROCAR XCEL NON-BLD 11X100MML (ENDOMECHANICALS) IMPLANT
TROCAR XCEL NON-BLD 5MMX100MML (ENDOMECHANICALS) ×3 IMPLANT
WARMER LAPAROSCOPE (MISCELLANEOUS) ×3 IMPLANT
WATER STERILE IRR 1000ML POUR (IV SOLUTION) ×3 IMPLANT

## 2020-10-02 NOTE — Discharge Instructions (Signed)
[facility] _______[facility] ° °HERNIA REPAIR: POST OP INSTRUCTIONS ° °Always review your discharge instruction sheet given to you by the facility where your surgery was performed. °IF YOU HAVE DISABILITY OR FAMILY LEAVE FORMS, YOU MUST BRING THEM TO THE OFFICE FOR PROCESSING.   °DO NOT GIVE THEM TO YOUR DOCTOR. ° °1. A  prescription for pain medication may be given to you upon discharge.  Take your pain medication as prescribed, if needed.  If narcotic pain medicine is not needed, then you may take acetaminophen (Tylenol) or ibuprofen (Advil) as needed. °2. Take your usually prescribed medications unless otherwise directed. °If you need a refill on your pain medication, please contact your pharmacy.  They will contact our office to request authorization. Prescriptions will not be filled after 5 pm or on week-ends. °3. You should follow a light diet the first 24 hours after arrival home, such as soup and crackers, etc.  Be sure to include lots of fluids daily.  Resume your normal diet the day after surgery. °4.Most patients will experience some swelling and bruising around the umbilicus or in the groin and scrotum.  Ice packs and reclining will help.  Swelling and bruising can take several days to resolve.  °6. It is common to experience some constipation if taking pain medication after surgery.  Increasing fluid intake and taking a stool softener (such as Colace) will usually help or prevent this problem from occurring.  A mild laxative (Milk of Magnesia or Miralax) should be taken according to package directions if there are no bowel movements after 48 hours. °7. Unless discharge instructions indicate otherwise, you may remove your bandages 24-48 hours after surgery, and you may shower at that time.  You may have steri-strips (small skin tapes) in place directly over the incision.  These strips should be left on the skin for 7-10 days.  If your surgeon used skin glue on the incision, you may shower in  24 hours.  The glue will flake off over the next 2-3 weeks.  Any sutures or staples will be removed at the office during your follow-up visit. °8. ACTIVITIES:  You may resume regular (light) daily activities beginning the next day--such as daily self-care, walking, climbing stairs--gradually increasing activities as tolerated.  You may have sexual intercourse when it is comfortable.  Refrain from any heavy lifting or straining until approved by your doctor. ° °a.You may drive when you are no longer taking prescription pain medication, you can comfortably wear a seatbelt, and you can safely maneuver your car and apply brakes. °b.RETURN TO WORK:   °_____________________________________________ ° °9.You should see your doctor in the office for a follow-up appointment approximately 2-3 weeks after your surgery.  Make sure that you call for this appointment within a day or two after you arrive home to insure a convenient appointment time. °10.OTHER INSTRUCTIONS: _________________________ °   _____________________________________ ° °WHEN TO CALL YOUR DOCTOR: °1. Fever over 101.0 °2. Inability to urinate °3. Nausea and/or vomiting °4. Extreme swelling or bruising °5. Continued bleeding from incision. °6. Increased pain, redness, or drainage from the incision ° °The clinic staff is available to answer your questions during regular business hours.  Please don’t hesitate to call and ask to speak to one of the nurses for clinical concerns.  If you have a medical emergency, go to the nearest emergency room or call 911.  A surgeon from [facility] is always on call at the hospital ° ° °[address], [address], [place], [place]  [address] ? ° [address] [address], [place], [place]   [address] °[phone] ? [phone] ? FAX [phone] °Web site: [email or web address] ° °

## 2020-10-02 NOTE — Anesthesia Preprocedure Evaluation (Signed)
Anesthesia Evaluation  Patient identified by MRN, date of birth, ID band Patient awake    Reviewed: Allergy & Precautions, NPO status , Patient's Chart, lab work & pertinent test results  Airway Mallampati: II  TM Distance: >3 FB Neck ROM: Full    Dental  (+) Chipped, Poor Dentition,    Pulmonary sleep apnea (mouth guard) ,    Pulmonary exam normal breath sounds clear to auscultation       Cardiovascular hypertension, Pt. on medications Normal cardiovascular exam Rhythm:Regular Rate:Normal     Neuro/Psych negative neurological ROS     GI/Hepatic Neg liver ROS, GERD  Medicated and Controlled,  Endo/Other  Obesity   Renal/GU negative Renal ROS     Musculoskeletal negative musculoskeletal ROS (+)   Abdominal   Peds  Hematology negative hematology ROS (+)   Anesthesia Other Findings Day of surgery medications reviewed with the patient.  Reproductive/Obstetrics                             Anesthesia Physical Anesthesia Plan  ASA: II  Anesthesia Plan: General   Post-op Pain Management:    Induction: Intravenous  PONV Risk Score and Plan: 3 and Midazolam, Dexamethasone and Ondansetron  Airway Management Planned: Oral ETT  Additional Equipment:   Intra-op Plan:   Post-operative Plan: Extubation in OR  Informed Consent: I have reviewed the patients History and Physical, chart, labs and discussed the procedure including the risks, benefits and alternatives for the proposed anesthesia with the patient or authorized representative who has indicated his/her understanding and acceptance.       Plan Discussed with: CRNA  Anesthesia Plan Comments:         Anesthesia Quick Evaluation

## 2020-10-02 NOTE — H&P (Signed)
History of Present Illness The patient is a 54 year old male who presents with an incisional hernia. Patient follow back up after CT scan. CT scan does reveal a 4.6 x 2.3 cm ventral hernia. There appears to be significant has of omentum within the hernia sac itself. Patient states he's had continued burning, stabbing sensation that does radiate to the back for the hernia. I did review the CT scan personally and discussed the CT scan in detail with the patient.     -----------------------------------------  Referred by: Dr. Phillips Odor Chief Complaint: Recurrent ventral hernia  Patient is a 54 year old male with a history of scoliosis with multiple back surgeries, history of hypertension, and a history of multiple recurrent incisional hernias that were fixed in 2000 and 2005. Patient states he is unsure whether or not mesh was used with these hernias.  He's had previous tendon placement in an X fashion over his abdominal wall to help with support his back. This was done in the past.  Patient states that he noticed that the hernia recurred about 2 months ago. He states that he felt that he repeat for this several weeks ago. He states that he was lifting furniture on the initial occurrence.  He has had no signs or symptoms of incarceration or granulation. He does notice some increased gassy symptoms, but is currently on antibiotics for likely epididymitis.    Allergies  Penicillins  Anaphylaxis, Swelling. Allergies Reconciled   Medication History  Robaxin-750 (750MG  Tablet, 1 (one) Oral qhs, Taken starting 07/04/2020) Active. oxyCODONE-Acetaminophen (10-325MG  Tablet, Oral) Active. NexIUM (40MG  Capsule DR, Oral) Active. Allegra (30MG  Tablet, Oral) Active. Albuterol Sulfate HFA (108 (90 Base)MCG/ACT Aerosol Soln, Inhalation) Active. Medications Reconciled    Review of Systems  General Not Present- Appetite Loss, Chills, Fatigue, Fever, Night Sweats,  Weight Gain and Weight Loss. Skin Not Present- Change in Wart/Mole, Dryness, Hives, Jaundice, New Lesions, Non-Healing Wounds, Rash and Ulcer. HEENT Present- Ringing in the Ears, Seasonal Allergies, Sinus Pain and Wears glasses/contact lenses. Not Present- Earache, Hearing Loss, Hoarseness, Nose Bleed, Oral Ulcers, Sore Throat, Visual Disturbances and Yellow Eyes. Respiratory Present- Snoring. Not Present- Bloody sputum, Chronic Cough, Difficulty Breathing and Wheezing. Breast Not Present- Breast Mass, Breast Pain, Nipple Discharge and Skin Changes. Cardiovascular Present- Leg Cramps. Not Present- Chest Pain, Difficulty Breathing Lying Down, Palpitations, Rapid Heart Rate, Shortness of Breath and Swelling of Extremities. Gastrointestinal Present- Abdominal Pain, Constipation and Indigestion. Not Present- Bloating, Bloody Stool, Change in Bowel Habits, Chronic diarrhea, Difficulty Swallowing, Excessive gas, Gets full quickly at meals, Hemorrhoids, Nausea, Rectal Pain and Vomiting. Male Genitourinary Present- Change in Urinary Stream and Nocturia. Not Present- Blood in Urine, Frequency, Impotence, Painful Urination, Urgency and Urine Leakage. Musculoskeletal Present- Back Pain. Not Present- Joint Pain, Joint Stiffness, Muscle Pain, Muscle Weakness and Swelling of Extremities. Neurological Not Present- Decreased Memory, Fainting, Headaches, Numbness, Seizures, Tingling, Tremor, Trouble walking and Weakness. Psychiatric Not Present- Anxiety, Bipolar, Change in Sleep Pattern, Depression, Fearful and Frequent crying. Endocrine Not Present- Cold Intolerance, Excessive Hunger, Hair Changes, Heat Intolerance and New Diabetes. Hematology Not Present- Blood Thinners, Easy Bruising, Excessive bleeding, Gland problems, HIV and Persistent Infections. All other systems negative  BP (!) 155/92   Pulse 68   Temp 98 F (36.7 C) (Oral)   Resp 18   Ht 5\' 5"  (1.651 m)   Wt 87 kg   SpO2 99%   BMI 31.93 kg/m      Physical Exam The physical exam findings are as follows: Note:  Constitutional: No acute distress, conversant, appears stated age  Eyes: Anicteric sclerae, moist conjunctiva, no lid lag  Neck: No thyromegaly, trachea midline, no cervical lymphadenopathy  Lungs: Clear to auscultation biilaterally, normal respiratory effot  Cardiovascular: regular rate & rhythm, no murmurs, no peripheal edema, pedal pulses 2+  GI: Soft, no masses or hepatosplenomegaly, non-tender to palpation  MSK: Normal gait, no clubbing cyanosis, edema  Skin: No rashes, palpation reveals normal skin turgor  Psychiatric: Appropriate judgment and insight, oriented to person, place, and time  Abdomen Inspection Hernias - Incisional - Reducible (Recurrent incisional hernia in the upper midline. Multiple incisions in the left upper left lower lateral quadrant, right upper right lower lateral quadrant. Patient has an incision just over the umbilicus in a transverse fashion.) .    Assessment & Plan  RECURRENT INCISIONAL HERNIA (K43.2) Impression: 54 year old male with recurrent incisional hernia, this is complicated with his history of previous hernia repair tests will scoliosis and surgeries for scoliosis. 1. The patient will like to proceed to the operating room for laparoscopic ventral hernia repair with mesh.  2. I discussed with the patient the signs and symptoms of incarceration and strangulation and the need to proceed to the ER should they occur.  3. I discussed with the patient the risks and benefits of the procedure to include but not limited to: Infection, bleeding, damage to surrounding structures, possible need for further surgery, possible nerve pain, and possible recurrence. The patient was understanding and wishes to proceed.   I reviewed the patient's external notes from the referring physicians as well as consulting physician team. Each of the radiologic studies and lab  studies were independently reviewed and interpreted. I discussed the results of the above studies and how they relate to the patient's surgical problems.

## 2020-10-02 NOTE — Anesthesia Procedure Notes (Addendum)
Procedure Name: Intubation Date/Time: 10/02/2020 9:48 AM Performed by: Kyung Rudd, CRNA Pre-anesthesia Checklist: Patient identified, Emergency Drugs available, Suction available and Patient being monitored Patient Re-evaluated:Patient Re-evaluated prior to induction Oxygen Delivery Method: Circle System Utilized Preoxygenation: Pre-oxygenation with 100% oxygen Induction Type: IV induction Ventilation: Mask ventilation without difficulty Laryngoscope Size: Mac and 3 Grade View: Grade I Tube type: Oral Tube size: 7.5 mm Number of attempts: 1 Airway Equipment and Method: Stylet and Oral airway Placement Confirmation: ETT inserted through vocal cords under direct vision,  positive ETCO2 and breath sounds checked- equal and bilateral Secured at: 22 cm Tube secured with: Tape Dental Injury: Teeth and Oropharynx as per pre-operative assessment  Comments: AOI by Brayton El, SRNA with Dr. Marcie Bal supervising.

## 2020-10-02 NOTE — Anesthesia Postprocedure Evaluation (Signed)
Anesthesia Post Note  Patient: Jerry Castillo  Procedure(s) Performed: LAPAROSCOPIC VENTRAL HERNIA REPAIR WITH MESH (N/A Abdomen) INSERTION OF MESH (N/A Abdomen)     Patient location during evaluation: PACU Anesthesia Type: General Level of consciousness: awake and alert Pain management: pain level controlled Vital Signs Assessment: post-procedure vital signs reviewed and stable Respiratory status: spontaneous breathing, nonlabored ventilation and respiratory function stable Cardiovascular status: blood pressure returned to baseline and stable Postop Assessment: no apparent nausea or vomiting Anesthetic complications: no Comments: Patient to be admitted on surgery service due to continued abdominal cramping not resolved with muscle relaxants.   No complications documented.  Last Vitals:  Vitals:   10/02/20 1615 10/02/20 1630  BP: (!) 186/81 (!) 182/82  Pulse: 67 79  Resp: 20 (!) 25  Temp:    SpO2: 96% 90%    Last Pain:  Vitals:   10/02/20 1615  TempSrc:   PainSc: Asleep                 Cecile Hearing

## 2020-10-02 NOTE — Progress Notes (Signed)
1830 Received pt from PACU via wheelchair, A&O x4. C/o severe abd cramping. Assisted to bed, pt fell asleep after assessment.  2 lap sited to mid abd dry and intact. Abd is soft, distended.

## 2020-10-02 NOTE — Transfer of Care (Signed)
Immediate Anesthesia Transfer of Care Note  Patient: Jerry Castillo  Procedure(s) Performed: LAPAROSCOPIC VENTRAL HERNIA REPAIR WITH MESH (N/A Abdomen) INSERTION OF MESH (N/A Abdomen)  Patient Location: PACU  Anesthesia Type:General  Level of Consciousness: awake and patient cooperative  Airway & Oxygen Therapy: Patient Spontanous Breathing and Patient connected to nasal cannula oxygen  Post-op Assessment: Report given to RN and Post -op Vital signs reviewed and stable  Post vital signs: Reviewed and stable  Last Vitals:  Vitals Value Taken Time  BP 183/104 10/02/20 1044  Temp    Pulse 61 10/02/20 1044  Resp 25 10/02/20 1044  SpO2 100 % 10/02/20 1044  Vitals shown include unvalidated device data.  Last Pain:  Vitals:   10/02/20 0852  TempSrc:   PainSc: 0-No pain      Patients Stated Pain Goal: 3 (10/02/20 0786)  Complications: No complications documented.

## 2020-10-02 NOTE — Op Note (Signed)
10/02/2020  10:25 AM  PATIENT:  Jerry Castillo  54 y.o. male  PRE-OPERATIVE DIAGNOSIS:  RECURRENT VENTRAL HERNIA  POST-OPERATIVE DIAGNOSIS:  RECURRENT VENTRAL HERNIA  PROCEDURE:  Procedure(s): LAPAROSCOPIC VENTRAL HERNIA REPAIR WITH MESH (N/A) INSERTION OF MESH (N/A)  SURGEON:  Surgeon(s) and Role:    * Axel Filler, MD - Primary  ASSISTANTS: SHARON HITCHCOCK, RNFA   ANESTHESIA:   local and general  EBL:  minimal   BLOOD ADMINISTERED:none  DRAINS: none   LOCAL MEDICATIONS USED:  BUPIVICAINE   SPECIMEN:  No Specimen  DISPOSITION OF SPECIMEN:  N/A  COUNTS:  YES  TOURNIQUET:  * No tourniquets in log *  DICTATION: .Dragon Dictation  Details of the procedure:   After the patient was consented patient was taken back to the operating room patient was then placed in supine position bilateral SCDs in place.  The patient was prepped and draped in the usual sterile fashion. After antibiotics were confirmed a timeout was called and all facts were verified. The Veress needle technique was used to insuflate the abdomen at the right subcostal margin. The abdomen was insufflated to 14 mm mercury. Subsequently a 5 mm trocar was placed a camera inserted there was no injury to any intra-abdominal organs.    There was seen to be an non- incarcerated  Recurrent epigastric hernia.  A second camera port was in placed into the right lower quadrant.   At this the Falicform ligament was taken down with Bovie cautery maintaining hemostasis.   I proceeded to reduce the hernia contents.  The hernia sac was dissected out of the hernia and disposed.  The fascia at the hernia was reapproximated using a #1Novafil x 5.  Once the hernia was cleared away, a Bard Ventralight 20x15cm  mesh was inserted into the abdomen.  The mesh was secured circumferentially with am Securestrap tacker in a double crown fashion.   decive.    The omentum was brought over the area of the mesh. The pneumoperitoneum was  evacuated  & all trocars  were removed. The skin was reapproximated with 4-0  Monocryl sutures in a subcuticular fashion. The skin was dressed with Dermabond.  The patient was taken to the recovery room in stable condition.  Type of repair -primary suture & mesh  Mesh overlap - 6cm  Placement of mesh -  beneath fascia and into peritoneal cavity   PLAN OF CARE: Discharge to home after PACU  PATIENT DISPOSITION:  PACU - hemodynamically stable.   Delay start of Pharmacological VTE agent (>24hrs) due to surgical blood loss or risk of bleeding: not applicable

## 2020-10-03 ENCOUNTER — Encounter (HOSPITAL_COMMUNITY): Payer: Self-pay | Admitting: General Surgery

## 2020-10-03 DIAGNOSIS — K432 Incisional hernia without obstruction or gangrene: Secondary | ICD-10-CM | POA: Diagnosis not present

## 2020-10-03 MED ORDER — METHOCARBAMOL 500 MG PO TABS
500.0000 mg | ORAL_TABLET | Freq: Four times a day (QID) | ORAL | Status: DC | PRN
  Administered 2020-10-03: 08:00:00 500 mg via ORAL
  Filled 2020-10-03: qty 1

## 2020-10-03 MED ORDER — OXYCODONE HCL 10 MG PO TABS: 10.0000 mg | ORAL_TABLET | ORAL | 0 refills | Status: DC | PRN

## 2020-10-03 MED ORDER — DIAZEPAM 5 MG PO TABS: 5.0000 mg | ORAL_TABLET | Freq: Four times a day (QID) | ORAL | 0 refills | Status: DC | PRN

## 2020-10-03 NOTE — Plan of Care (Signed)

## 2020-10-03 NOTE — Discharge Summary (Signed)
Physician Discharge Summary  Patient ID: Jerry Castillo MRN: [id number] DOB/AGE: August 08, 1966 54 y.o.  Admit date: 10/02/2020 Discharge date: 10/03/2020  Admission Diagnoses:s/p hernia repair  Discharge Diagnoses:  Active Problems:   S/P hernia repair   Discharged Condition: good  Hospital Course: Pt admitted s/p hernia repair.  Pt with significant abd cramping and pain.  Pt was started on muscled relaxer and did well.  Pain was reasonable and pt was tol PO well.  Pt was deemed stable for DC and Dc'd home.  Consults: None  Significant Diagnostic Studies: none  Treatments: surgery: as above  Discharge Exam: Blood pressure (!) 142/77, pulse 63, temperature 98 F (36.7 C), temperature source Oral, resp. rate 19, height 5\' 5"  (1.651 m), weight 87 kg, SpO2 99 %. General appearance: alert and cooperative GI: soft, non-tender; bowel sounds normal; no masses,  no organomegaly and inc c/d/i  Disposition: Discharge disposition: 01-Home or Self Care       Discharge Instructions    Diet - low sodium heart healthy   Complete by: As directed    Increase activity slowly   Complete by: As directed    Increase activity slowly   Complete by: As directed      Allergies as of 10/03/2020      Reactions   Penicillins Anaphylaxis, Swelling   Throat swelling      Medication List    TAKE these medications   albuterol 108 (90 Base) MCG/ACT inhaler Commonly known as: VENTOLIN HFA Inhale 2 puffs into the lungs every 6 (six) hours as needed for wheezing or shortness of breath.   AMINO ACID PO Take 1 capsule by mouth 3 (three) times daily.   BC FAST PAIN RELIEF PO Take 1 packet by mouth 2 (two) times daily as needed (sinus headahces).   CALCIUM PO Take 1 tablet by mouth daily as needed (cramping).   diazepam 10 MG tablet Commonly known as: VALIUM Take 10 mg by mouth every 6 (six) hours as needed. What changed: Another medication with the same name was added. Make sure you  understand how and when to take each.   diazepam 5 MG tablet Commonly known as: VALIUM Take 1 tablet (5 mg total) by mouth every 6 (six) hours as needed for muscle spasms. What changed: You were already taking a medication with the same name, and this prescription was added. Make sure you understand how and when to take each.   esomeprazole 20 MG capsule Commonly known as: NEXIUM Take 20 mg by mouth daily.   lisinopril 20 MG tablet Commonly known as: ZESTRIL Take 20 mg by mouth daily.   montelukast 10 MG tablet Commonly known as: SINGULAIR Take 10 mg by mouth daily.   Mucinex Maximum Strength 1200 MG Tb12 Generic drug: Guaifenesin Take 1,200 mg by mouth 3 (three) times daily as needed (congestion).   multivitamin with minerals Tabs tablet Take 1 tablet by mouth daily.   oxycodone 5 MG capsule Commonly known as: OXY-IR Take 10 mg by mouth every 4 (four) hours as needed for pain. What changed: Another medication with the same name was added. Make sure you understand how and when to take each.   Oxycodone HCl 10 MG Tabs Take 1 tablet (10 mg total) by mouth every 4 (four) hours as needed for moderate pain. What changed: You were already taking a medication with the same name, and this prescription was added. Make sure you understand how and when to take each.   oxymetazoline 0.05 %  nasal spray Commonly known as: AFRIN Place 1 spray into both nostrils 2 (two) times daily as needed for congestion.   POTASSIUM PO Take 1 tablet by mouth daily as needed (cramping).   traMADol 50 MG tablet Commonly known as: Ultram Take 1 tablet (50 mg total) by mouth every 6 (six) hours as needed.   VISINE OP Place 1 drop into both eyes daily as needed (irritation).       Follow-up Information    Axel Filler, MD. Schedule an appointment as soon as possible for a visit in 2 weeks.   Specialty: General Surgery Why: Post op visit Contact information: [address] [place] [address] [phone]               Signed: Axel Filler 10/03/2020, 7:21 AM

## 2021-09-30 IMAGING — CT CT ABD-PELV W/O CM
2 of 4 series · 16 of 46 positions shown, 18 images · non-contrast
Comparison: None.

CLINICAL DATA: Painful incisional hernia after lifting injury.

EXAM:
CT ABDOMEN AND PELVIS WITHOUT CONTRAST
TECHNIQUE: Multidetector CT imaging of the abdomen and pelvis was performed
following the standard protocol without IV contrast.

[Series 2: axial st · axial · 0.89mm/px · z∈[+936,+1261]mm · 13 of 76 slices shown, 15 images]
[im 6/76  soft-tissue]
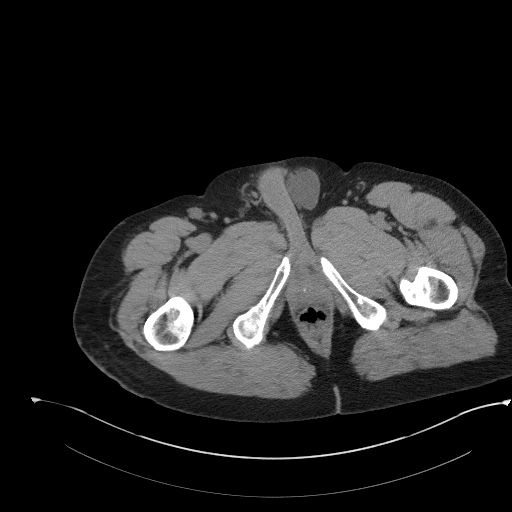
[im 6/76  bone]
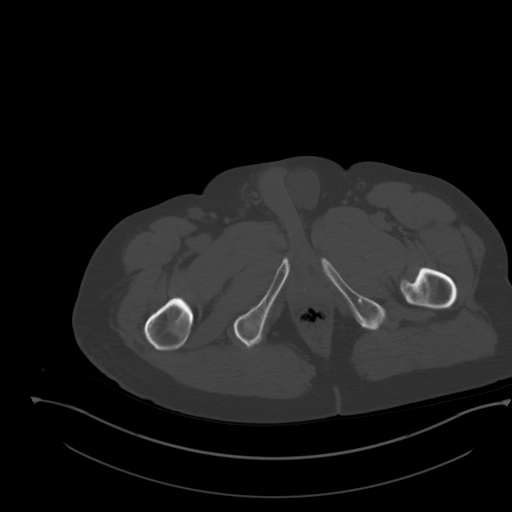
[im 11/76  soft-tissue]
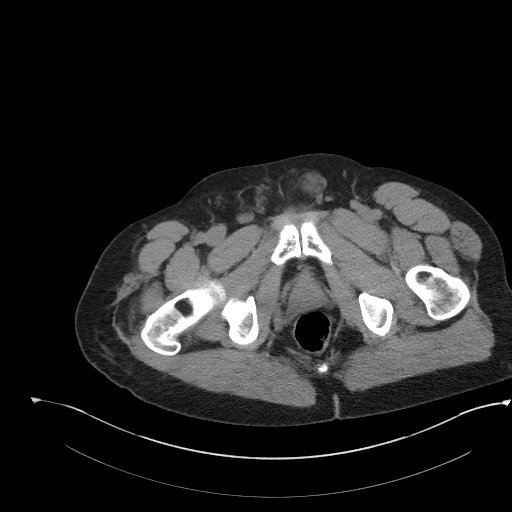
[im 16/76  soft-tissue]
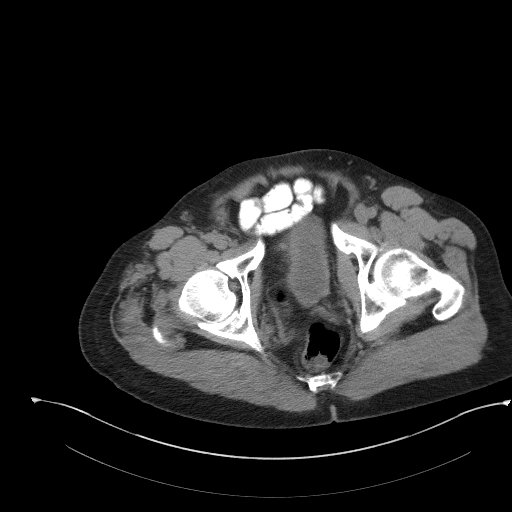
[im 21/76  soft-tissue]
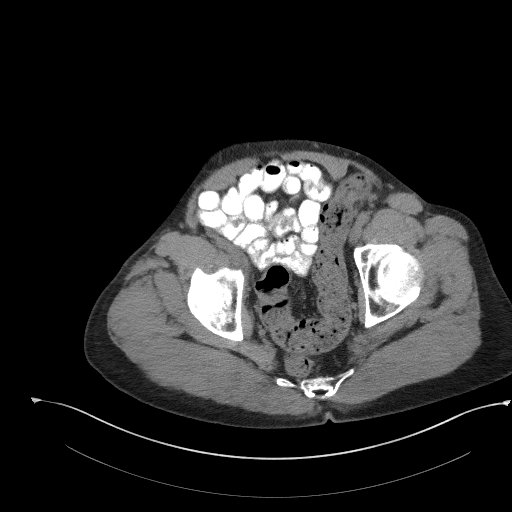
[im 26/76  soft-tissue]
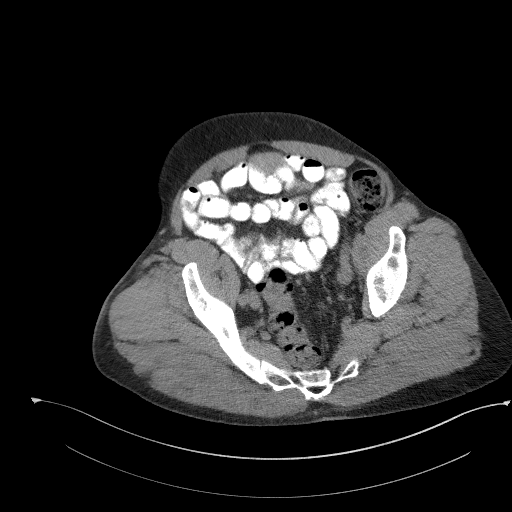
[im 31/76  soft-tissue]
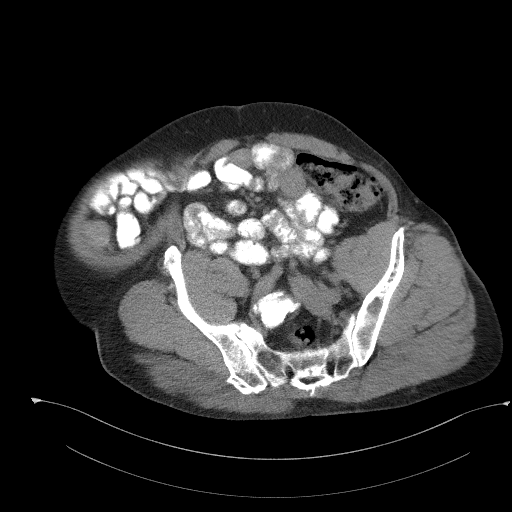
[im 41/76  soft-tissue]
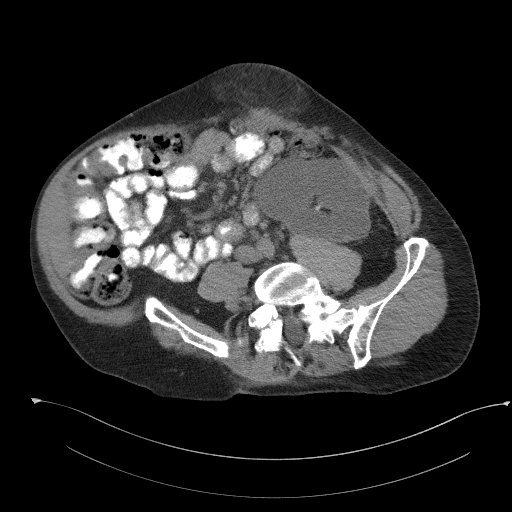
[im 46/76  soft-tissue]
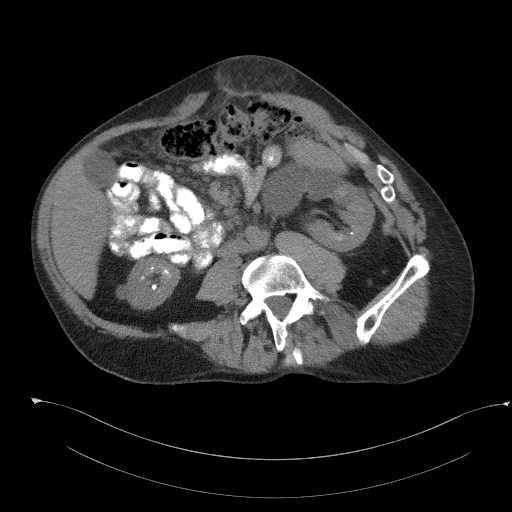
[im 51/76  soft-tissue]
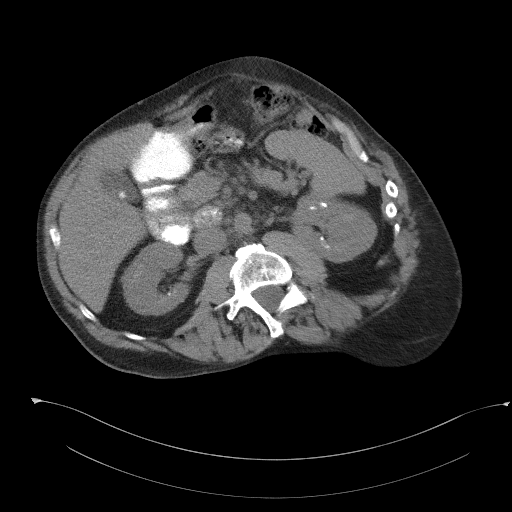
[im 51/76  bone]
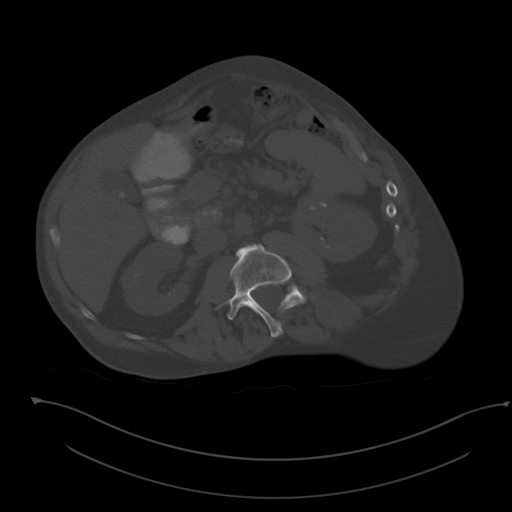
[im 56/76  soft-tissue]
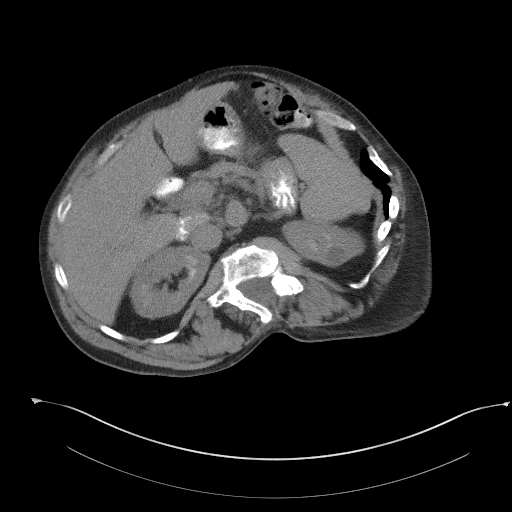
[im 61/76  soft-tissue]
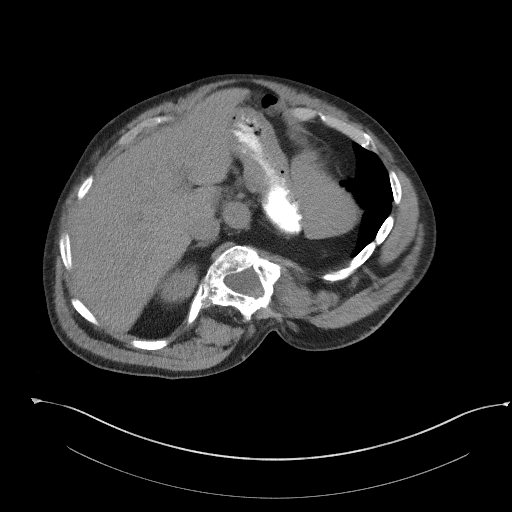
[im 66/76  soft-tissue]
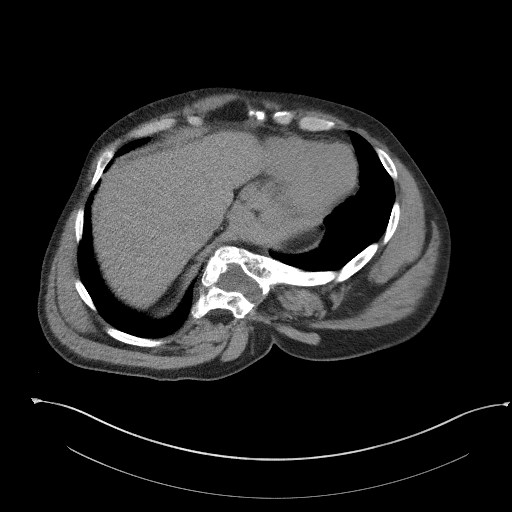
[im 71/76  soft-tissue]
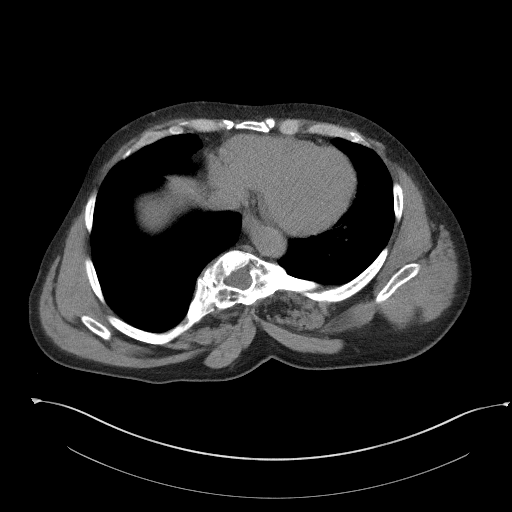

[Series 5: coronal st · coronal · 0.77mm/px · 3 of 114 slices shown]
[im 38/114  soft-tissue]
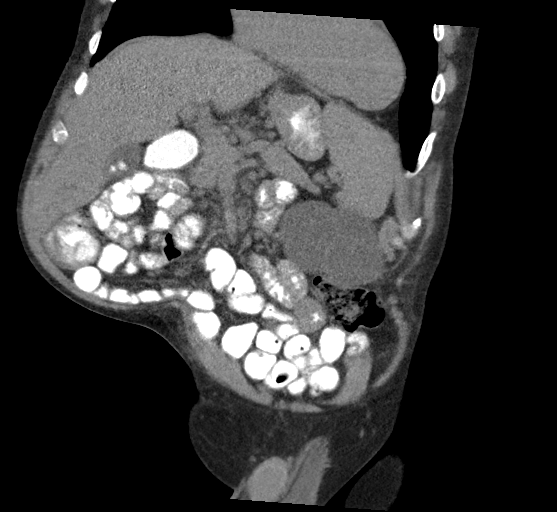
[im 51/114  soft-tissue]
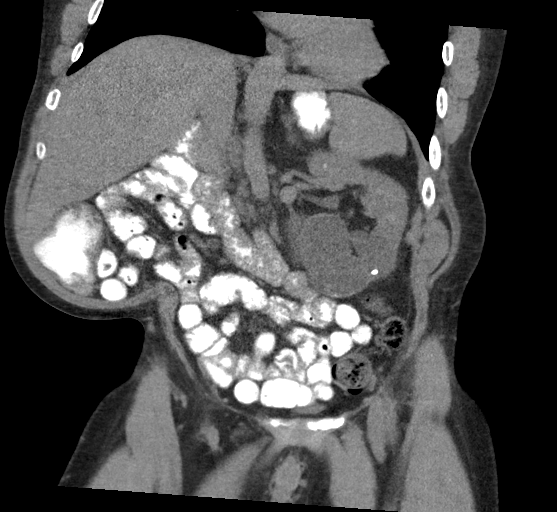
[im 63/114  soft-tissue]
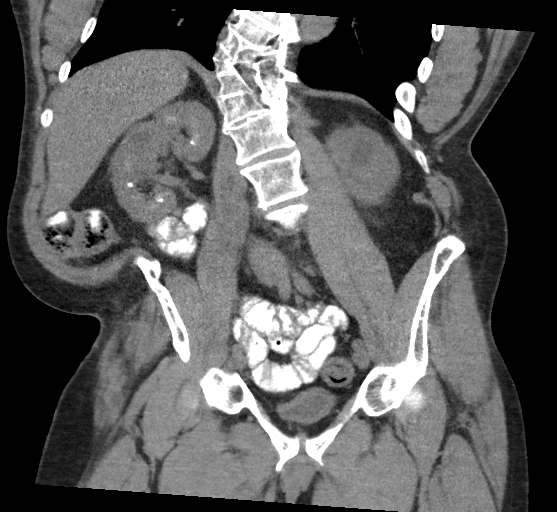

[16 of 46 positions shown; findings below may reference images not displayed]

FINDINGS: Lower chest: Nodular ground-glass opacities are noted in the left
lung base with the largest nodule 7 mm. Right lung base clear. No
effusions.

Hepatobiliary: No focal hepatic abnormality. Small layering
gallstones within the gallbladder. No biliary ductal dilatation.

Pancreas: No focal abnormality or ductal dilatation.

Spleen: No focal abnormality.  Normal size.

Adrenals/Urinary Tract: Bilateral nonobstructing renal stones, the
largest in the left lower pole measuring up to 10 mm. Medullary
nephrocalcinosis bilaterally. Complex multi-septated cystic area
within the lower pole of the left kidney versus multiple adjacent
cysts. Largest area measures up to 6.8 cm.

Stomach/Bowel: Stomach, large and small bowel grossly unremarkable.

Vascular/Lymphatic: No evidence of aneurysm or adenopathy.

Reproductive: No visible focal abnormality.

Other: No free fluid or free air. There is a moderate-sized midline
ventral hernia containing fat. Mild haziness/stranding in the
herniated fat could reflect incarceration/strangulation.

Musculoskeletal: No acute bony abnormality. Severe thoracolumbar
scoliosis and associated degenerative changes.
IMPRESSION: Midline ventral hernia containing fat. Haziness/stranding within the
fat could reflect incarceration/strangulation.

Cholelithiasis.

Bilateral nephrolithiasis.  No hydronephrosis.

Medullary nephrocalcinosis.

Multiple left lower pole renal cysts versus complex multi-septated
large renal cyst. This could be further evaluated with ultrasound or
MRI.

## 2022-02-09 ENCOUNTER — Encounter: Payer: Self-pay | Admitting: *Deleted

## 2022-10-17 ENCOUNTER — Encounter (HOSPITAL_COMMUNITY): Payer: Self-pay | Admitting: Behavioral Health

## 2022-10-17 ENCOUNTER — Other Ambulatory Visit (INDEPENDENT_AMBULATORY_CARE_PROVIDER_SITE_OTHER)
Admission: EM | Admit: 2022-10-17 | Discharge: 2022-10-18 | Disposition: A | Discharge status: Other Acute Inpatient Hospital | Payer: BC Managed Care – PPO | Source: Home / Self Care | Attending: Behavioral Health | Admitting: Behavioral Health

## 2022-10-17 DIAGNOSIS — Z79899 Other long term (current) drug therapy: Secondary | ICD-10-CM | POA: Insufficient documentation

## 2022-10-17 DIAGNOSIS — Z789 Other specified health status: Secondary | ICD-10-CM | POA: Diagnosis not present

## 2022-10-17 DIAGNOSIS — F1020 Alcohol dependence, uncomplicated: Secondary | ICD-10-CM | POA: Diagnosis present

## 2022-10-17 DIAGNOSIS — K219 Gastro-esophageal reflux disease without esophagitis: Secondary | ICD-10-CM | POA: Insufficient documentation

## 2022-10-17 DIAGNOSIS — I4510 Unspecified right bundle-branch block: Secondary | ICD-10-CM | POA: Insufficient documentation

## 2022-10-17 DIAGNOSIS — R42 Dizziness and giddiness: Secondary | ICD-10-CM | POA: Insufficient documentation

## 2022-10-17 DIAGNOSIS — Z1152 Encounter for screening for COVID-19: Secondary | ICD-10-CM | POA: Insufficient documentation

## 2022-10-17 DIAGNOSIS — R110 Nausea: Secondary | ICD-10-CM | POA: Insufficient documentation

## 2022-10-17 DIAGNOSIS — R7989 Other specified abnormal findings of blood chemistry: Secondary | ICD-10-CM | POA: Diagnosis not present

## 2022-10-17 DIAGNOSIS — I10 Essential (primary) hypertension: Secondary | ICD-10-CM | POA: Insufficient documentation

## 2022-10-17 DIAGNOSIS — G4730 Sleep apnea, unspecified: Secondary | ICD-10-CM | POA: Insufficient documentation

## 2022-10-17 DIAGNOSIS — R778 Other specified abnormalities of plasma proteins: Secondary | ICD-10-CM | POA: Insufficient documentation

## 2022-10-17 DIAGNOSIS — R0602 Shortness of breath: Secondary | ICD-10-CM | POA: Insufficient documentation

## 2022-10-17 DIAGNOSIS — R519 Headache, unspecified: Secondary | ICD-10-CM | POA: Insufficient documentation

## 2022-10-17 DIAGNOSIS — F10930 Alcohol use, unspecified with withdrawal, uncomplicated: Secondary | ICD-10-CM | POA: Diagnosis not present

## 2022-10-17 DIAGNOSIS — F10139 Alcohol abuse with withdrawal, unspecified: Secondary | ICD-10-CM | POA: Insufficient documentation

## 2022-10-17 DIAGNOSIS — R9431 Abnormal electrocardiogram [ECG] [EKG]: Secondary | ICD-10-CM | POA: Diagnosis not present

## 2022-10-17 DIAGNOSIS — R1013 Epigastric pain: Secondary | ICD-10-CM | POA: Diagnosis present

## 2022-10-17 DIAGNOSIS — I213 ST elevation (STEMI) myocardial infarction of unspecified site: Secondary | ICD-10-CM | POA: Diagnosis not present

## 2022-10-17 LAB — CBC WITH DIFFERENTIAL/PLATELET
Abs Immature Granulocytes: 0.03 10*3/uL (ref 0.00–0.07)
Basophils Absolute: 0 10*3/uL (ref 0.0–0.1)
Basophils Relative: 0 %
Eosinophils Absolute: 0 10*3/uL (ref 0.0–0.5)
Eosinophils Relative: 0 %
HCT: 45.8 % (ref 39.0–52.0)
Hemoglobin: 16.9 g/dL (ref 13.0–17.0)
Immature Granulocytes: 0 %
Lymphocytes Relative: 19 %
Lymphs Abs: 1.7 10*3/uL (ref 0.7–4.0)
MCH: 32.9 pg (ref 26.0–34.0)
MCHC: 36.9 g/dL — ABNORMAL HIGH (ref 30.0–36.0)
MCV: 89.1 fL (ref 80.0–100.0)
Monocytes Absolute: 0.6 10*3/uL (ref 0.1–1.0)
Monocytes Relative: 7 %
Neutro Abs: 6.4 10*3/uL (ref 1.7–7.7)
Neutrophils Relative %: 74 %
Platelets: 271 10*3/uL (ref 150–400)
RBC: 5.14 MIL/uL (ref 4.22–5.81)
RDW: 11.7 % (ref 11.5–15.5)
WBC: 8.7 10*3/uL (ref 4.0–10.5)
nRBC: 0 % (ref 0.0–0.2)

## 2022-10-17 LAB — POCT URINE DRUG SCREEN - MANUAL ENTRY (I-SCREEN)
POC Amphetamine UR: NOT DETECTED
POC Buprenorphine (BUP): POSITIVE — AB
POC Cocaine UR: NOT DETECTED
POC Marijuana UR: NOT DETECTED
POC Methadone UR: NOT DETECTED
POC Methamphetamine UR: NOT DETECTED
POC Morphine: NOT DETECTED
POC Oxazepam (BZO): POSITIVE — AB
POC Oxycodone UR: NOT DETECTED — AB
POC Secobarbital (BAR): NOT DETECTED

## 2022-10-17 LAB — COMPREHENSIVE METABOLIC PANEL
ALT: 101 U/L — ABNORMAL HIGH (ref 0–44)
AST: 126 U/L — ABNORMAL HIGH (ref 15–41)
Albumin: 4.4 g/dL (ref 3.5–5.0)
Alkaline Phosphatase: 67 U/L (ref 38–126)
Anion gap: 24 — ABNORMAL HIGH (ref 5–15)
BUN: 23 mg/dL — ABNORMAL HIGH (ref 6–20)
CO2: 23 mmol/L (ref 22–32)
Calcium: 8.7 mg/dL — ABNORMAL LOW (ref 8.9–10.3)
Chloride: 89 mmol/L — ABNORMAL LOW (ref 98–111)
Creatinine, Ser: 1.17 mg/dL (ref 0.61–1.24)
GFR, Estimated: >60 mL/min (ref >60)
Glucose, Bld: 138 mg/dL — ABNORMAL HIGH (ref 70–99)
Potassium: 3.8 mmol/L (ref 3.5–5.1)
Sodium: 136 mmol/L (ref 135–145)
Total Bilirubin: 0.9 mg/dL (ref 0.3–1.2)
Total Protein: 7.6 g/dL (ref 6.5–8.1)

## 2022-10-17 LAB — LIPID PANEL
Cholesterol: 282 mg/dL — ABNORMAL HIGH (ref 0–200)
HDL: 86 mg/dL (ref >40)
LDL Cholesterol: 159 mg/dL — ABNORMAL HIGH (ref 0–99)
Total CHOL/HDL Ratio: 3.3 RATIO
Triglycerides: 184 mg/dL — ABNORMAL HIGH (ref <150)
VLDL: 37 mg/dL (ref 0–40)

## 2022-10-17 LAB — RESP PANEL BY RT-PCR (RSV, FLU A&B, COVID)  RVPGX2
Influenza A by PCR: NEGATIVE
Influenza B by PCR: NEGATIVE
Resp Syncytial Virus by PCR: NEGATIVE
SARS Coronavirus 2 by RT PCR: NEGATIVE

## 2022-10-17 LAB — ETHANOL: Alcohol, Ethyl (B): 256 mg/dL — ABNORMAL HIGH (ref <10)

## 2022-10-17 LAB — TSH: TSH: 0.554 u[IU]/mL (ref 0.350–4.500)

## 2022-10-17 LAB — MAGNESIUM: Magnesium: 2.5 mg/dL — ABNORMAL HIGH (ref 1.7–2.4)

## 2022-10-17 LAB — POC SARS CORONAVIRUS 2 AG: SARSCOV2ONAVIRUS 2 AG: NEGATIVE

## 2022-10-17 LAB — HIV ANTIBODY (ROUTINE TESTING W REFLEX): HIV Screen 4th Generation wRfx: NONREACTIVE

## 2022-10-17 MED ORDER — MAGNESIUM HYDROXIDE 400 MG/5ML PO SUSP
30.0000 mL | Freq: Every day | ORAL | Status: DC | PRN
Start: 2022-10-17 — End: 2022-10-18

## 2022-10-17 MED ORDER — THIAMINE MONONITRATE 100 MG PO TABS
100.0000 mg | ORAL_TABLET | Freq: Every day | ORAL | Status: DC
  Administered 2022-10-18: 100 mg via ORAL
  Filled 2022-10-17: qty 1

## 2022-10-17 MED ORDER — THIAMINE HCL 100 MG/ML IJ SOLN
100.0000 mg | Freq: Once | INTRAMUSCULAR | Status: AC
Start: 2022-10-17 — End: 2022-10-17
  Administered 2022-10-17: 100 mg via INTRAMUSCULAR
  Filled 2022-10-17: qty 2

## 2022-10-17 MED ORDER — LISINOPRIL 20 MG PO TABS
20.0000 mg | ORAL_TABLET | Freq: Every day | ORAL | Status: DC
  Administered 2022-10-18: 20 mg via ORAL
  Filled 2022-10-17: qty 1

## 2022-10-17 MED ORDER — LORAZEPAM 1 MG PO TABS: 1.0000 mg | ORAL_TABLET | Freq: Two times a day (BID) | ORAL | Status: DC

## 2022-10-17 MED ORDER — ADULT MULTIVITAMIN W/MINERALS CH
1.0000 | ORAL_TABLET | Freq: Every day | ORAL | Status: DC
  Administered 2022-10-17 – 2022-10-18 (×2): 1 via ORAL
  Filled 2022-10-17 (×2): qty 1

## 2022-10-17 MED ORDER — CLONIDINE HCL 0.1 MG PO TABS
0.1000 mg | ORAL_TABLET | Freq: Four times a day (QID) | ORAL | Status: DC | PRN
  Filled 2022-10-17: qty 1

## 2022-10-17 MED ORDER — DOXYCYCLINE HYCLATE 100 MG PO TABS
100.0000 mg | ORAL_TABLET | Freq: Two times a day (BID) | ORAL | Status: AC
  Administered 2022-10-17 (×2): 100 mg via ORAL
  Filled 2022-10-17 (×2): qty 1

## 2022-10-17 MED ORDER — LORAZEPAM 1 MG PO TABS
1.0000 mg | ORAL_TABLET | Freq: Four times a day (QID) | ORAL | Status: AC
  Administered 2022-10-17 – 2022-10-18 (×4): 1 mg via ORAL
  Filled 2022-10-17 (×4): qty 1

## 2022-10-17 MED ORDER — LORAZEPAM 1 MG PO TABS: 1.0000 mg | ORAL_TABLET | Freq: Three times a day (TID) | ORAL | Status: DC

## 2022-10-17 MED ORDER — PANTOPRAZOLE SODIUM 40 MG PO TBEC
40.0000 mg | DELAYED_RELEASE_TABLET | Freq: Every day | ORAL | Status: DC
  Administered 2022-10-17 – 2022-10-18 (×2): 40 mg via ORAL
  Filled 2022-10-17 (×2): qty 1

## 2022-10-17 MED ORDER — ALUM & MAG HYDROXIDE-SIMETH 200-200-20 MG/5ML PO SUSP
30.0000 mL | ORAL | Status: DC | PRN
Start: 2022-10-17 — End: 2022-10-18
  Administered 2022-10-17: 30 mL via ORAL
  Filled 2022-10-17: qty 30

## 2022-10-17 MED ORDER — LORAZEPAM 1 MG PO TABS: 1.0000 mg | ORAL_TABLET | Freq: Four times a day (QID) | ORAL | Status: DC | PRN

## 2022-10-17 MED ORDER — LORAZEPAM 2 MG/ML IJ SOLN
2.0000 mg | Freq: Once | INTRAMUSCULAR | Status: AC
  Administered 2022-10-17: 2 mg via INTRAMUSCULAR
  Filled 2022-10-17: qty 1

## 2022-10-17 MED ORDER — LORAZEPAM 1 MG PO TABS: 1.0000 mg | ORAL_TABLET | Freq: Every day | ORAL | Status: DC

## 2022-10-17 MED ORDER — ACETAMINOPHEN 325 MG PO TABS
650.0000 mg | ORAL_TABLET | Freq: Four times a day (QID) | ORAL | Status: DC | PRN
Start: 2022-10-17 — End: 2022-10-18
  Administered 2022-10-17 – 2022-10-18 (×3): 650 mg via ORAL
  Filled 2022-10-17 (×3): qty 2

## 2022-10-17 MED ORDER — ONDANSETRON 4 MG PO TBDP
4.0000 mg | ORAL_TABLET | Freq: Four times a day (QID) | ORAL | Status: DC | PRN
  Administered 2022-10-17 (×2): 4 mg via ORAL
  Filled 2022-10-17 (×2): qty 1

## 2022-10-17 MED ORDER — HYDROXYZINE HCL 25 MG PO TABS
25.0000 mg | ORAL_TABLET | Freq: Four times a day (QID) | ORAL | Status: DC | PRN
  Filled 2022-10-17: qty 1

## 2022-10-17 MED ORDER — LOPERAMIDE HCL 2 MG PO CAPS: 2.0000 mg | ORAL_CAPSULE | ORAL | Status: DC | PRN

## 2022-10-17 MED ORDER — ALBUTEROL SULFATE HFA 108 (90 BASE) MCG/ACT IN AERS: 2.0000 | INHALATION_SPRAY | Freq: Four times a day (QID) | RESPIRATORY_TRACT | Status: DC | PRN

## 2022-10-17 NOTE — ED Notes (Addendum)
Pt arrives at [facility]  wanting to receive alcohol detox. Denies SI/HI/AVH. Calm, cooperative throughout interview process. Skin assessment completed. Oriented to unit. Meal and drink offered. At currrent, pt continue to deny SI/HI/AVH. Pt verbally contract for safety. Will monitor for safety.Patient has shoes and sock in locker. Patient also has medication in  his locker omeprazole, zolpidem,doxycycline lisinopril,albuterol per reana lpn.

## 2022-10-17 NOTE — ED Notes (Addendum)
Pt awake, voices c/o anxiety, nausea, itching and some visual hallucinations. States ' I can see things on the ceiling". BP 108/78  HR: 108. CIWA: 15. Provider notified.

## 2022-10-17 NOTE — BH Assessment (Addendum)
Comprehensive Clinical Assessment (CCA) Note  10/17/2022 Jerry Castillo [id number]  Disposition: Per Darrol Angel patient admission to [facility] is recommended.    The patient demonstrates the following risk factors for suicide: Chronic risk factors for suicide include: substance use disorder. Acute risk factors for suicide include: loss (financial, interpersonal, professional). Protective factors for this patient include: positive social support, responsibility to others (children, family), coping skills, and hope for the future. Considering these factors, the overall suicide risk at this point appears to be low. Patient is appropriate for outpatient follow up.  Patient is a 56 year old male with a history of Alcohol Use Disorder who presents voluntarily to [facility] for assessment.  Patient presents accompanied by family, for assessment. He reports he has been sober for 5 years and just relapsed 1 week ago. Patient has a history of alcohol use that became a daily problem when he pursued treatment at [facility] 5 yrs ago.  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 He states he is having difficulty managing stress, as employees are "constantly texting and emailing me all hours of the day and night." Patient denies any psychiatric history and he denies SI, HI or AVH. He is tearful, stating he is embarrassed that he relapsed. He is here with his mother, sister and brother-in-law who are all supportive. Treatment options were discussed and patient is agreeable with recommendation for admission to [facility] for detox.  His family is supportive of this plan.   Chief Complaint: Request for alcohol detox  Visit Diagnosis: Alcohol Use Disorder, moderate  Flowsheet Row ED from 10/17/2022 in [facility]  Thoughts that you would be better off dead, or of hurting yourself in some way Not at all  PHQ-9 Total  Score 9      [place] ED from 10/17/2022 in [place] No Risk        CCA Screening, Triage and Referral (STR)  Patient Reported Information How did you hear about [place]? Family/Friend  What Is the Reason for Your Visit/Call Today? Patient is a 56 y.o. male with a history of Alcohol Use Disorder who presents voluntarily, accompanied by family, for assessment.  He reports he has been sober for 5 years and just relapsed 1 week ago.  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  He states he is having difficulty managing stress, as employees are "constantly texting and emailing me all hours of the day and night."  Patient denies any psychiatric history and he denies SI, HI or AVH.  He is tearful, stating he is embarrassed that he relapsed. He is here with his mother, sister and brother-in-law who are all supportive.  How Long Has This Been Causing You Problems? 1 wk - 1 month  What Do You Feel Would Help You the Most Today? Alcohol or Drug Use Treatment   Have You Recently Had Any Thoughts About Hurting Yourself? No  Are You Planning to Commit Suicide/Harm Yourself At This time? No     Have you Recently Had Thoughts About [place]? No  Are You Planning to Harm Someone at This Time? No  Explanation: N/A   Have You Used Any Alcohol or Drugs in the Past 24 Hours? Yes  What Did You Use and How Much? ETOH - a  fifth of vodka early this morning   Do You Currently Have a Therapist/Psychiatrist? No  Name of Therapist/Psychiatrist: Name of Therapist/Psychiatrist: N/A   Have You Been Recently Discharged From Any Office Practice or Programs? No  Explanation of Discharge From Practice/Program: N/A     CCA Screening Triage Referral Assessment Type of Contact: Face-to-Face  Telemedicine Service Delivery:   Is this Initial or Reassessment?   Date Telepsych  consult ordered in [facility]:    Time Telepsych consult ordered in [facility]:    Location of Assessment: [facility]  Provider Location: No data recorded  Collateral Involvement: Brother-in-law and sister provide collateral   Does Patient Have a Stage manager Guardian? No  Legal Guardian Contact Information: N/A  Copy of Legal Guardianship Form: No data recorded Legal Guardian Notified of Arrival: No data recorded Legal Guardian Notified of Pending Discharge: No data recorded If Minor and Not Living with Parent(s), Who has Custody? N/A  Is CPS involved or ever been involved? Never  Is APS involved or ever been involved? Never   Patient Determined To Be At Risk for Harm To Self or Others Based on Review of Patient Reported Information or Presenting Complaint? No  Method: No data recorded Availability of Means: No data recorded Intent: No data recorded Notification Required: No data recorded Additional Information for Danger to Others Potential: No data recorded Additional Comments for Danger to Others Potential: N/A  Are There Guns or Other Weapons in Your Home? No  Types of Guns/Weapons: N/A  Are These Weapons Safely Secured?                            No data recorded Who Could Verify You Are Able To Have These Secured: N/A  Do You Have any Outstanding Charges, Pending Court Dates, Parole/Probation? N/A  Contacted To Inform of Risk of Harm To Self or Others: No data recorded   Does Patient Present under Involuntary Commitment? No    [place] of Residence: [place]   Patient Currently Receiving the Following Services: Not Receiving Services   Determination of Need: Urgent (48 hours)   Options For Referral: Facility-Based Crisis     CCA Biopsychosocial Patient Reported Schizophrenia/Schizoaffective Diagnosis in Past: No   Strengths: Seeking treatment, has family support   Mental Health Symptoms Depression:   Worthlessness   Duration  of Depressive symptoms:  Duration of Depressive Symptoms: Less than two weeks   Mania:   None   Anxiety:    Tension; Worrying   Psychosis:   None   Duration of Psychotic symptoms:    Trauma:   None   Obsessions:   None   Compulsions:   None   Inattention:   N/A   Hyperactivity/Impulsivity:   N/A   Oppositional/Defiant Behaviors:   N/A   Emotional Irregularity:  No data recorded  Other Mood/Personality Symptoms:   Recent relapse, reports feeling embarrassed by relapse after 5 yrs sober    Mental Status Exam Appearance and self-care  Stature:   Average   Weight:   Average weight   Clothing:   Casual   Grooming:   Normal   Cosmetic use:   None   Posture/gait:   Normal   Motor activity:   Not Remarkable   Sensorium  Attention:   Normal   Concentration:   Normal   Orientation:   X5   Recall/memory:   Normal   Affect and Mood  Affect:  Depressed   Mood:   Depressed   Relating  Eye contact:   Normal   Facial expression:   Responsive   Attitude toward examiner:   Cooperative   Thought and Language  Speech flow:  Clear and Coherent   Thought content:   Appropriate to Mood and Circumstances   Preoccupation:   Guilt   Hallucinations:   None   Organization:   Intact   Company secretary of Knowledge:   Average   Intelligence:   Average   Abstraction:   Normal   Judgement:   Impaired   Reality Testing:   Adequate   Insight:   Gaps   Decision Making:   Impulsive   Social Functioning  Social Maturity:   Isolates   Social Judgement:   Normal   Stress  Stressors:   Work   Coping Ability:   Contractor Deficits:   Self-control   Supports:   Family     Religion: Religion/Spirituality Are You A Religious Person?: No How Might This Affect Treatment?: NA  Leisure/Recreation: Leisure / Recreation Do You Have Hobbies?: No  Exercise/Diet: Exercise/Diet Do You Exercise?:  No Have You Gained or Lost A Significant Amount of Weight in the Past Six Months?: No Do You Follow a Special Diet?: No Do You Have Any Trouble Sleeping?: No   CCA Employment/Education Employment/Work Situation: Employment / Work Situation Employment Situation: Employed Work Stressors: Research officer, 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?: No  Education: Education Is Patient Currently Attending School?: No Last Grade Completed:  (NA) Did You Product manager?:  (NA) Did You Have An Individualized Education Program (IIEP): No Did You Have Any Difficulty At School?: No Patient's Education Has Been Impacted by Current Illness: No   CCA Family/Childhood History Family and Relationship History: Family history Marital status: Divorced Divorced, when?: NA What types of issues is patient dealing with in the relationship?: N/A Additional relationship information: N/A Does patient have children?: No  Childhood History:  Childhood History By whom was/is the patient raised?: Both parents Did patient suffer any verbal/emotional/physical/sexual abuse as a child?: No Did patient suffer from severe childhood neglect?: No Has patient ever been sexually abused/assaulted/raped as an adolescent or adult?: No Was the patient ever a victim of a crime or a disaster?: No Witnessed domestic violence?: No Has patient been affected by domestic violence as an adult?: No       CCA Substance Use Alcohol/Drug Use: Alcohol / Drug Use Pain Medications: See MAR Prescriptions: See MAR Over the Counter: See MAR History of alcohol / drug use?: Yes Longest period of sobriety (when/how long): 5 years, up until 1 week ago Negative Consequences of Use: Work / Programmer, multimedia, Copywriter, advertising relationships Withdrawal Symptoms: Nausea / Vomiting Substance #1 Name of  Substance 1: ETOH 1 - Age of First Use: teens 1 - Amount (size/oz): a fifth or more 1 - Frequency: daily 1 - Duration: 1 week 1 - Last Use / Amount: this morning - fifth of vodka 1 - Method of Aquiring: NA 1- Route of Use: drinks                       ASAM's:  Six Dimensions of Multidimensional Assessment  Dimension 1:  Acute Intoxication and/or Withdrawal Potential:   Dimension 1:  Description of individual's past  and current experiences of substance use and withdrawal: Mild signs of withdrawal - currently worsening sx  Dimension 2:  Biomedical Conditions and Complications:   Dimension 2:  Description of patient's biomedical conditions and  complications: fully functioning, able to cope with physical discomfort  Dimension 3:  Emotional, Behavioral, or Cognitive Conditions and Complications:  Dimension 3:  Description of emotional, behavioral, or cognitive conditions and complications: Struggles with managing work related stress  Dimension 4:  Readiness to Change:  Dimension 4:  Description of Readiness to Change criteria: Seeking treatment, however asking to stay "1 day" wanting to return to work [employer].  Dimension 5:  Relapse, Continued use, or Continued Problem Potential:  Dimension 5:  Relapse, continued use, or continued problem potential critiera description: recent long period of sobriety, feels embarrassed for relapse  Dimension 6:  Recovery/Living Environment:  Dimension 6:  Recovery/Iiving environment criteria description: family is supportive  ASAM Severity Score: ASAM's Severity Rating Score: 4  ASAM Recommended Level of Treatment: ASAM Recommended Level of Treatment: Level III Residential Treatment   Substance use Disorder (SUD) Substance Use Disorder (SUD)  Checklist Symptoms of Substance Use: Continued use despite persistent or recurrent social, interpersonal problems, caused or exacerbated by use, Evidence of withdrawal (Comment), Social, occupational, recreational  activities given up or reduced due to use  Recommendations for Services/Supports/Treatments: Recommendations for Services/Supports/Treatments Recommendations For Services/Supports/Treatments: Facility Based Crisis  Discharge Disposition:    DSM5 Diagnoses: Patient Active Problem List   Diagnosis Date Noted   S/P hernia repair 10/02/2020     Referrals to Alternative Service(s): Referred to Alternative Service(s):   Place:   Date:   Time:    Referred to Alternative Service(s):   Place:   Date:   Time:    Referred to Alternative Service(s):   Place:   Date:   Time:    Referred to Alternative Service(s):   Place:   Date:   Time:     Fransico Meadow, [facility]

## 2022-10-17 NOTE — ED Provider Notes (Cosign Needed Addendum)
Per nursing patient requested to speak with his provider regarding his pain medication. Patient states that he had back surgery about a month ago and at that time he was taking Vicodin but is no longer taking Vicodin. He states that he has been taking Suboxone however his prescribing doctor retired. He states that the last time the Suboxone was filled was last month. Patient does not have an active prescription.  I discussed with the patient that if he does not have an active prescription this provider will not restart controlled substance. I discussed with the patient taking Tylenol or naproxen as scheduled for pain. Patient verbalizes understanding. Per PDMP patient is not prescribed Vicodin or Suboxone. Urine drug screen has yet to be obtained.   Update: Patient admitted to nursing that he has been getting Suboxone from off the street to treat pain. Patient urine drug screen positive for buprenorphine and benzodiazepines. Will order cows and clonidine 0.1 mg po every 6 hours for withdrawal for cows greater than 9.

## 2022-10-17 NOTE — ED Provider Notes (Signed)
Facility Based Crisis Admission H&P  Date: 10/17/22 Patient Name: Jerry Castillo MRN: [id number] Chief Complaint: alcohol detox     Diagnoses:  Final diagnoses:  Alcohol withdrawal syndrome without complication (HCC)  Alcohol use    HPI: KENON Castillo is a 56 year old male patient with no past psychiatric history who presented to the [facility] behavioral health urgent care voluntary accompanied by his family requesting alcohol detox.  Patient seen and evaluated face-to-face by this provider, chart reviewed and case discussed with Dr. Lucianne Muss. On evaluation, patient is alert and oriented x 4. His thought process is linear and speech is clear and coherent at a moderate tone. His mood is anxious and affect is congruent. He is ambulatory. He is calm and cooperative. He is here today requesting alcohol detox. He reports to drinking 1/5 of vodka or more every day for the past week. He states that he relapsed on alcohol a week ago after a sobriety of 5 years. He last consumed alcohol about 6 or 7 hours prior to arrival and states that he drank about 1/5 of vodka. He states that he relapsed on alcohol due to work related stress. 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 He states that over the past month he has received a lot of stress from employers from them texting and emailing him day and night. He reports past substance abuse treatment at [facility] approximately 5 years ago. He denies using illicit drugs. He reports alcohol withdrawal symptoms of nausea, feeling sick on his stomach, headaches, and body aches. He denies a history of alcohol withdrawal seizures or DTs. He denies depressive symptoms. He denies SI/HI/AVH. There is no objective evidence that the patient is currently responding to internal or external stimuli. He reports fair sleep. He reports a poor appetite due to alcohol use. He currently resides at home with his parents. He denies a family  history of substance abuse or mental health illness. He reports a medical history of hypertension and acid reflux. He provides medication bottles for the following medications: lisinopril 20 mg p.o. daily, omeprazole 40 mg p.o. daily, albuterol 2 mg tabs take 1 tablet every 6 hours as needed, Zolpidem 10  mg po once daily and doxycyline 100 mg po BID x 10 days with two tablets left for a sinus infection.   PHQ 2-9:     Total Time spent with patient: 45 minutes  Musculoskeletal  Strength & Muscle Tone: within normal limits Gait & Station: normal Patient leans: N/A  Psychiatric Specialty Exam  Presentation General Appearance:  Appropriate for Environment  Eye Contact: Fair  Speech: Clear and Coherent  Speech Volume: Normal  Handedness:No data recorded  Mood and Affect  Mood: Anxious  Affect: Congruent   Thought Process  Thought Processes: Coherent; Goal Directed  Descriptions of Associations:Intact  Orientation:Full (Time, Place and Person)  Thought Content:Logical  Diagnosis of Schizophrenia or Schizoaffective disorder in past: No   Hallucinations:Hallucinations: None  Ideas of Reference:None  Suicidal Thoughts:Suicidal Thoughts: No  Homicidal Thoughts:Homicidal Thoughts: No   Sensorium  Memory: Immediate Fair; Recent Fair; Remote Fair  Judgment: Fair  Insight: Fair   Art therapist  Concentration: Fair  Attention Span: Fair  Recall: [employer] of Knowledge: Fair  Language: Fair   Psychomotor Activity  Psychomotor Activity: Psychomotor Activity: Normal   Assets  Assets: Communication Skills; Desire for Improvement; Financial Resources/Insurance; Housing; Leisure Time; Physical Health; Social Support; Transportation   Sleep  Sleep:  Sleep: Fair Number of Hours of Sleep: 8   Nutritional Assessment (For OBS and FBC admissions only) Has the patient had a weight loss or gain of 10 pounds or more in the last 3 months?:  No Has the patient had a decrease in food intake/or appetite?: No Does the patient have dental problems?: No Does the patient have eating habits or behaviors that may be indicators of an eating disorder including binging or inducing vomiting?: No Has the patient recently lost weight without trying?: 0 Has the patient been eating poorly because of a decreased appetite?: 0 Malnutrition Screening Tool Score: 0    Physical Exam HENT:     Head: Normocephalic.     Nose: Nose normal.  Cardiovascular:     Rate and Rhythm: Tachycardia present.  Pulmonary:     Effort: Pulmonary effort is normal.  Musculoskeletal:        General: Normal range of motion.     Cervical back: Normal range of motion.  Neurological:     Mental Status: He is alert and oriented to person, place, and time.    Review of Systems  Constitutional: Negative.   HENT: Negative.    Cardiovascular: Negative.   Gastrointestinal:  Positive for nausea.  Genitourinary: Negative.   Musculoskeletal:  Positive for myalgias.  Skin: Negative.   Neurological:  Positive for headaches.  Endo/Heme/Allergies: Negative.   Psychiatric/Behavioral:  Positive for substance abuse.     Blood pressure 116/85, pulse (!) 109, temperature 98.1 F (36.7 C), temperature source Oral, resp. rate 20, SpO2 100 %. There is no height or weight on file to calculate BMI.  Past Psychiatric History: No past psychiatric history. History or alcohol and opiate abuse.   Is the patient at risk to self? No  Has the patient been a risk to self in the past 6 months? No .    Has the patient been a risk to self within the distant past? No   Is the patient a risk to others? No   Has the patient been a risk to others in the past 6 months? No   Has the patient been a risk to others within the distant past? No   Past Medical History:  Past Medical History:  Diagnosis Date   GERD (gastroesophageal reflux disease)    Heart murmur    as a child   History of  seasonal allergies    Hypertension    Pleurisy    Pneumonia    Scoliosis    Sleep apnea    uses a mouth guard, unable to tolerate CPAP    Past Surgical History:  Procedure Laterality Date   BACK SURGERY     x3 for scoliosis   HERNIA REPAIR     x2   INSERTION OF MESH N/A 10/02/2020   Procedure: INSERTION OF MESH;  Surgeon: Axel Filler, MD;  Location: [facility] OR;  Service: General;  Laterality: N/A;   LEG SURGERY Right    repair - plates and screws   MANDIBLE FRACTURE SURGERY     NASAL SINUS SURGERY     VENTRAL HERNIA REPAIR  10/02/2020   VENTRAL HERNIA REPAIR N/A 10/02/2020   Procedure: LAPAROSCOPIC VENTRAL HERNIA REPAIR WITH MESH;  Surgeon: Axel Filler, MD;  Location: [facility] OR;  Service: General;  Laterality: N/A;    Family History: No family history on file.  Social History:  Social History   Socioeconomic History   Marital status: Divorced    Spouse name: Not on file  Number of children: Not on file   Years of education: Not on file   Highest education level: Not on file  Occupational History   Not on file  Tobacco Use   Smoking status: Never   Smokeless tobacco: Current    Types: Snuff  Vaping Use   Vaping Use: Never used  Substance and Sexual Activity   Alcohol use: Not Currently   Drug use: Not Currently   Sexual activity: Not on file  Other Topics Concern   Not on file  Social History Narrative   Not on file   Social Determinants of Health   Financial Resource Strain: Not on file  Food Insecurity: Not on file  Transportation Needs: Not on file  Physical Activity: Not on file  Stress: Not on file  Social Connections: Not on file  Intimate Partner Violence: Not on file    SDOH:  SDOH Screenings   Tobacco Use: High Risk (10/03/2020)    Last Labs:  No visits with results within 6 Month(s) from this visit.  Latest known visit with results is:  Admission on 10/02/2020, Discharged on 10/03/2020  Component Date Value Ref Range Status    Creatinine, Ser 10/02/2020 1.28 (H)  0.61 - 1.24 mg/dL Final   GFR, Estimated 10/02/2020 >60  >60 mL/min Final   Comment: (NOTE) Calculated using the CKD-EPI Creatinine Equation (2021) Performed at [facility], [address] [place], [place] [address]     Allergies: Penicillins  PTA Medications: (Not in a hospital admission)   Long Term Goals: Improvement in symptoms so as ready for discharge  Short Term Goals: Patient will attend at least of 50% of the groups daily. and Patient will take medications as prescribed daily.  Medical Decision Making  Patient admitted to the [facility] facility based crisis unit for alcohol use disorder and alcohol detox. Patient is voluntary.  Labs Lab Orders         Resp panel by RT-PCR (RSV, Flu A&B, Covid) Anterior Nasal Swab         CBC with Differential/Platelet         Comprehensive metabolic panel         Hemoglobin A1c         Ethanol         Lipid panel         TSH         RPR         HIV Antibody (routine testing w rflx)         POCT Urine Drug Screen - (I-Screen)         POC SARS Coronavirus 2 Ag     Home medications Will restart lisinopril 20 mg p.o. daily for hypertension Will start Protonix 40 mg p.o. daily in place of omeprazole 40 mg Will start albuterol inhaler as needed every 6 hours for shortness of breath or wheezing in place of albuterol 2 mg 1 tablet every 6 hours Will restart doxycycline 100 mg p.o BID x 2 doses for sinus infection Will hold Zolpidem 10 mg po daily due to starting ativan taper for alcohol withdrawal   AUD Start ativan 1 mg taper, consider changing to Librium pending liver enzymes results  Add CIWA    Recommendations  Based on my evaluation the patient does not appear to have an emergency medical condition.  Layla Barter, NP 10/17/22  1:23 PM

## 2022-10-17 NOTE — ED Notes (Signed)
Patient  sleeping in no acute stress. RR even and unlabored .Environment secured .Will continue to monitor for safely. 

## 2022-10-17 NOTE — ED Notes (Signed)
Pt stated that he has recently had surgery and prescribed "something to come off the pain pills". Patient is unsure of the name of the medication. Patient requested to see the provider regarding this. Informed provider via secure chat.

## 2022-10-17 NOTE — ED Notes (Signed)
PC to [facility], magnesium level added to blood collected earlier.

## 2022-10-17 NOTE — ED Provider Notes (Cosign Needed Addendum)
Nursing presents to EKG to provider, QTC 517. Repeat QTC 510. Patient evaluated by provider and is asymptomatic. Patient reports no significant cardiac history except for hypertension. Patient's BUN is slightly elevated at 23. Patient reports poor appetite due to drinking alcohol and reports drinking some fluids. Patient encouraged to increase fluid intake as it could be contributing to elevated QTc. Will order magnesium level. Will repeat EKG on 10/18/22.  EKG/chart reviewed by Dr. Denton Lank at [facility]. Per Dr. Denton Lank "a lot of the time the computer read over-estimates the QTC. So in his case, when I plug in his heart rate of 105 and qt being 8-9 small boxes, it comes up with something in the 420-470 range, which in asymptomatic is ok."

## 2022-10-17 NOTE — ED Notes (Signed)
Pt awake. A/O x 4. Denies SI/Hi/AVH.  Resp even and unlabored. Will continue to monitor for safety

## 2022-10-17 NOTE — Progress Notes (Signed)
Patients second EKG shows the Q/T interval is trending down slightly.  Provider encouraged increased PO fluids, an additional EKG tomorrow morning and observation of patient to ensure he remains asymptomatic.  In addition, if he begins to demonstrate opiate withdrawal there is clonidine ordered .

## 2022-10-17 NOTE — ED Notes (Signed)
Patient transported to [facility] by [facility]

## 2022-10-17 NOTE — ED Notes (Signed)
Patient admitted to [facility] from [facility] for etoh detox.  Patient also reports taking street suboxone after he stopped taking oxycontin for surgeries he has had.  He has had 3 major surgeries for severe scoliosis.  When th oxycontin was stopped patient went through withdrawal and started the suboxone. He is on cows protocol and clonidine is ordered  He also reports drinking ETOH daily.  Patient smells of etoh as he came into facility intoxicated.   Started on ativan taper.  Patient EKG with prolonged Q/T intervals.  Provider aware and second EKG being completed now.  Patients BAL = 258 when he ws admitted.  Patient started on ativan taper.  He completed admission process and oriented to unit.  He was also given scrubs as he could not keep his pants up.  He was also given  and  gatorade and encouraged to increase PO fluids.  Patient anxious but pleasant.  Will continue to monitor for etoh withdrawal.

## 2022-10-18 ENCOUNTER — Encounter (HOSPITAL_COMMUNITY)
Admission: EM | Disposition: A | Discharge status: Home / Self Care | Payer: Self-pay | Source: Home / Self Care | Attending: Emergency Medicine

## 2022-10-18 ENCOUNTER — Other Ambulatory Visit: Payer: Self-pay

## 2022-10-18 ENCOUNTER — Observation Stay (HOSPITAL_COMMUNITY)
Admission: EM | Admit: 2022-10-18 | Discharge: 2022-10-19 | Disposition: A | Discharge status: Home / Self Care | Payer: BC Managed Care – PPO | Attending: Cardiovascular Disease | Admitting: Cardiovascular Disease

## 2022-10-18 ENCOUNTER — Encounter (HOSPITAL_COMMUNITY): Payer: Self-pay | Admitting: Cardiology

## 2022-10-18 ENCOUNTER — Emergency Department (HOSPITAL_COMMUNITY): Payer: BC Managed Care – PPO

## 2022-10-18 ENCOUNTER — Encounter (HOSPITAL_COMMUNITY): Payer: Self-pay | Admitting: Behavioral Health

## 2022-10-18 DIAGNOSIS — I213 ST elevation (STEMI) myocardial infarction of unspecified site: Secondary | ICD-10-CM | POA: Diagnosis not present

## 2022-10-18 DIAGNOSIS — Z79899 Other long term (current) drug therapy: Secondary | ICD-10-CM | POA: Insufficient documentation

## 2022-10-18 DIAGNOSIS — R079 Chest pain, unspecified: Secondary | ICD-10-CM | POA: Diagnosis present

## 2022-10-18 DIAGNOSIS — R7989 Other specified abnormal findings of blood chemistry: Secondary | ICD-10-CM | POA: Diagnosis not present

## 2022-10-18 DIAGNOSIS — R9431 Abnormal electrocardiogram [ECG] [EKG]: Secondary | ICD-10-CM | POA: Diagnosis present

## 2022-10-18 DIAGNOSIS — I4510 Unspecified right bundle-branch block: Secondary | ICD-10-CM

## 2022-10-18 DIAGNOSIS — F10930 Alcohol use, unspecified with withdrawal, uncomplicated: Secondary | ICD-10-CM | POA: Diagnosis not present

## 2022-10-18 DIAGNOSIS — I10 Essential (primary) hypertension: Secondary | ICD-10-CM | POA: Insufficient documentation

## 2022-10-18 DIAGNOSIS — E785 Hyperlipidemia, unspecified: Secondary | ICD-10-CM | POA: Insufficient documentation

## 2022-10-18 DIAGNOSIS — R110 Nausea: Secondary | ICD-10-CM

## 2022-10-18 HISTORY — PX: LEFT HEART CATH AND CORONARY ANGIOGRAPHY: CATH118249

## 2022-10-18 LAB — CBC WITH DIFFERENTIAL/PLATELET
Abs Immature Granulocytes: 0.02 10*3/uL (ref 0.00–0.07)
Basophils Absolute: 0 10*3/uL (ref 0.0–0.1)
Basophils Relative: 0 %
Eosinophils Absolute: 0 10*3/uL (ref 0.0–0.5)
Eosinophils Relative: 0 %
HCT: 38.7 % — ABNORMAL LOW (ref 39.0–52.0)
Hemoglobin: 13.9 g/dL (ref 13.0–17.0)
Immature Granulocytes: 0 %
Lymphocytes Relative: 27 %
Lymphs Abs: 2 10*3/uL (ref 0.7–4.0)
MCH: 32.8 pg (ref 26.0–34.0)
MCHC: 35.9 g/dL (ref 30.0–36.0)
MCV: 91.3 fL (ref 80.0–100.0)
Monocytes Absolute: 0.8 10*3/uL (ref 0.1–1.0)
Monocytes Relative: 11 %
Neutro Abs: 4.4 10*3/uL (ref 1.7–7.7)
Neutrophils Relative %: 62 %
Platelets: 180 10*3/uL (ref 150–400)
RBC: 4.24 MIL/uL (ref 4.22–5.81)
RDW: 11.7 % (ref 11.5–15.5)
WBC: 7.2 10*3/uL (ref 4.0–10.5)
nRBC: 0 % (ref 0.0–0.2)

## 2022-10-18 LAB — COMPREHENSIVE METABOLIC PANEL
ALT: 79 U/L — ABNORMAL HIGH (ref 0–44)
AST: 95 U/L — ABNORMAL HIGH (ref 15–41)
Albumin: 3.6 g/dL (ref 3.5–5.0)
Alkaline Phosphatase: 61 U/L (ref 38–126)
Anion gap: 10 (ref 5–15)
BUN: 16 mg/dL (ref 6–20)
CO2: 31 mmol/L (ref 22–32)
Calcium: 9.1 mg/dL (ref 8.9–10.3)
Chloride: 92 mmol/L — ABNORMAL LOW (ref 98–111)
Creatinine, Ser: 1.27 mg/dL — ABNORMAL HIGH (ref 0.61–1.24)
GFR, Estimated: >60 mL/min (ref >60)
Glucose, Bld: 108 mg/dL — ABNORMAL HIGH (ref 70–99)
Potassium: 3.4 mmol/L — ABNORMAL LOW (ref 3.5–5.1)
Sodium: 133 mmol/L — ABNORMAL LOW (ref 135–145)
Total Bilirubin: 1.1 mg/dL (ref 0.3–1.2)
Total Protein: 6 g/dL — ABNORMAL LOW (ref 6.5–8.1)

## 2022-10-18 LAB — MAGNESIUM: Magnesium: 2.2 mg/dL (ref 1.7–2.4)

## 2022-10-18 LAB — POCT ACTIVATED CLOTTING TIME: Activated Clotting Time: 261 seconds

## 2022-10-18 LAB — RPR: RPR Ser Ql: NONREACTIVE

## 2022-10-18 LAB — LIPID PANEL
Cholesterol: 217 mg/dL — ABNORMAL HIGH (ref 0–200)
HDL: 81 mg/dL (ref >40)
LDL Cholesterol: 112 mg/dL — ABNORMAL HIGH (ref 0–99)
Total CHOL/HDL Ratio: 2.7 RATIO
Triglycerides: 120 mg/dL (ref <150)
VLDL: 24 mg/dL (ref 0–40)

## 2022-10-18 LAB — PROTIME-INR
INR: 1.1 (ref 0.8–1.2)
Prothrombin Time: 13.8 seconds (ref 11.4–15.2)

## 2022-10-18 LAB — TROPONIN I (HIGH SENSITIVITY)
Troponin I (High Sensitivity): 74 ng/L — ABNORMAL HIGH (ref <18)
Troponin I (High Sensitivity): 56 ng/L — ABNORMAL HIGH (ref <18)
Troponin I (High Sensitivity): 68 ng/L — ABNORMAL HIGH (ref <18)

## 2022-10-18 LAB — TSH: TSH: 1.241 u[IU]/mL (ref 0.350–4.500)

## 2022-10-18 LAB — T4, FREE: Free T4: 1.45 ng/dL — ABNORMAL HIGH (ref 0.61–1.12)

## 2022-10-18 LAB — APTT: aPTT: 23 seconds — ABNORMAL LOW (ref 24–36)

## 2022-10-18 LAB — BRAIN NATRIURETIC PEPTIDE: B Natriuretic Peptide: 64.7 pg/mL (ref 0.0–100.0)

## 2022-10-18 SURGERY — LEFT HEART CATH AND CORONARY ANGIOGRAPHY
Anesthesia: LOCAL

## 2022-10-18 MED ORDER — FENTANYL CITRATE (PF) 100 MCG/2ML IJ SOLN
INTRAMUSCULAR | Status: DC | PRN
  Administered 2022-10-18: 50 ug via INTRAVENOUS

## 2022-10-18 MED ORDER — LORAZEPAM 0.5 MG PO TABS: 0.5000 mg | ORAL_TABLET | Freq: Every day | ORAL | Status: DC

## 2022-10-18 MED ORDER — LORAZEPAM 2 MG/ML IJ SOLN: 0.0000 mg | Freq: Four times a day (QID) | INTRAMUSCULAR | Status: DC

## 2022-10-18 MED ORDER — LORAZEPAM 1 MG PO TABS: 1.0000 mg | ORAL_TABLET | Freq: Once | ORAL | Status: DC

## 2022-10-18 MED ORDER — SODIUM CHLORIDE 0.9 % IV SOLN: INTRAVENOUS | Status: DC

## 2022-10-18 MED ORDER — LORAZEPAM 1 MG PO TABS: 1.0000 mg | ORAL_TABLET | ORAL | Status: DC | PRN

## 2022-10-18 MED ORDER — NITROGLYCERIN 1 MG/10 ML FOR IR/CATH LAB
INTRA_ARTERIAL | Status: AC
  Filled 2022-10-18: qty 10

## 2022-10-18 MED ORDER — FOLIC ACID 1 MG PO TABS
1.0000 mg | ORAL_TABLET | Freq: Every day | ORAL | Status: DC
  Administered 2022-10-19: 1 mg via ORAL
  Filled 2022-10-18: qty 1

## 2022-10-18 MED ORDER — NITROGLYCERIN 0.4 MG SL SUBL: 0.4000 mg | SUBLINGUAL_TABLET | SUBLINGUAL | Status: DC | PRN

## 2022-10-18 MED ORDER — SODIUM CHLORIDE 0.9% FLUSH
3.0000 mL | Freq: Two times a day (BID) | INTRAVENOUS | Status: DC
  Administered 2022-10-18 – 2022-10-19 (×2): 3 mL via INTRAVENOUS

## 2022-10-18 MED ORDER — IOHEXOL 350 MG/ML SOLN
INTRAVENOUS | Status: DC | PRN
  Administered 2022-10-18: 70 mL

## 2022-10-18 MED ORDER — LORAZEPAM 1 MG PO TABS
1.0000 mg | ORAL_TABLET | Freq: Once | ORAL | Status: AC
  Administered 2022-10-19: 1 mg via ORAL
  Filled 2022-10-18: qty 1

## 2022-10-18 MED ORDER — HYDRALAZINE HCL 20 MG/ML IJ SOLN: 10.0000 mg | INTRAMUSCULAR | Status: AC | PRN

## 2022-10-18 MED ORDER — LORAZEPAM 1 MG PO TABS: 1.0000 mg | ORAL_TABLET | Freq: Two times a day (BID) | ORAL | Status: DC

## 2022-10-18 MED ORDER — VERAPAMIL HCL 2.5 MG/ML IV SOLN
INTRAVENOUS | Status: AC
  Filled 2022-10-18: qty 2

## 2022-10-18 MED ORDER — ONDANSETRON HCL 4 MG/2ML IJ SOLN: 4.0000 mg | Freq: Four times a day (QID) | INTRAMUSCULAR | Status: DC | PRN

## 2022-10-18 MED ORDER — HEPARIN SODIUM (PORCINE) 1000 UNIT/ML IJ SOLN
INTRAMUSCULAR | Status: AC
  Filled 2022-10-18: qty 10

## 2022-10-18 MED ORDER — ATORVASTATIN CALCIUM 80 MG PO TABS: 80.0000 mg | ORAL_TABLET | Freq: Every day | ORAL | Status: DC

## 2022-10-18 MED ORDER — THIAMINE HCL 100 MG/ML IJ SOLN: 100.0000 mg | Freq: Every day | INTRAMUSCULAR | Status: DC

## 2022-10-18 MED ORDER — MIDAZOLAM HCL 2 MG/2ML IJ SOLN
INTRAMUSCULAR | Status: DC | PRN
  Administered 2022-10-18: 2 mg via INTRAVENOUS

## 2022-10-18 MED ORDER — METOPROLOL TARTRATE 12.5 MG HALF TABLET
12.5000 mg | ORAL_TABLET | Freq: Two times a day (BID) | ORAL | Status: DC
  Administered 2022-10-18 – 2022-10-19 (×2): 12.5 mg via ORAL
  Filled 2022-10-18 (×2): qty 1

## 2022-10-18 MED ORDER — HEPARIN (PORCINE) IN NACL 1000-0.9 UT/500ML-% IV SOLN
INTRAVENOUS | Status: DC | PRN
  Administered 2022-10-18 (×2): 500 mL

## 2022-10-18 MED ORDER — ACETAMINOPHEN 325 MG PO TABS: 650.0000 mg | ORAL_TABLET | ORAL | Status: DC | PRN

## 2022-10-18 MED ORDER — SODIUM CHLORIDE 0.9 % IV SOLN: 250.0000 mL | INTRAVENOUS | Status: DC | PRN

## 2022-10-18 MED ORDER — HEPARIN SODIUM (PORCINE) 5000 UNIT/ML IJ SOLN
4000.0000 [IU] | Freq: Once | INTRAMUSCULAR | Status: AC
  Administered 2022-10-18: 4000 [IU] via INTRAVENOUS
  Filled 2022-10-18: qty 1

## 2022-10-18 MED ORDER — LIDOCAINE HCL (PF) 1 % IJ SOLN
INTRAMUSCULAR | Status: DC | PRN
  Administered 2022-10-18: 2 mL

## 2022-10-18 MED ORDER — SODIUM CHLORIDE 0.9% FLUSH: 3.0000 mL | INTRAVENOUS | Status: DC | PRN

## 2022-10-18 MED ORDER — SALINE SPRAY 0.65 % NA SOLN
1.0000 | Freq: Once | NASAL | Status: AC
  Administered 2022-10-18: 1 via NASAL
  Filled 2022-10-18: qty 44

## 2022-10-18 MED ORDER — VERAPAMIL HCL 2.5 MG/ML IV SOLN
INTRAVENOUS | Status: DC | PRN
  Administered 2022-10-18: 10 mL via INTRA_ARTERIAL

## 2022-10-18 MED ORDER — FENTANYL CITRATE (PF) 100 MCG/2ML IJ SOLN
INTRAMUSCULAR | Status: AC
  Filled 2022-10-18: qty 2

## 2022-10-18 MED ORDER — LABETALOL HCL 5 MG/ML IV SOLN: 10.0000 mg | INTRAVENOUS | Status: AC | PRN

## 2022-10-18 MED ORDER — ADULT MULTIVITAMIN W/MINERALS CH
1.0000 | ORAL_TABLET | Freq: Every day | ORAL | Status: DC
  Administered 2022-10-19: 1 via ORAL
  Filled 2022-10-18: qty 1

## 2022-10-18 MED ORDER — LORAZEPAM 2 MG/ML IJ SOLN: 1.0000 mg | INTRAMUSCULAR | Status: DC | PRN

## 2022-10-18 MED ORDER — MIDAZOLAM HCL 2 MG/2ML IJ SOLN
INTRAMUSCULAR | Status: AC
  Filled 2022-10-18: qty 2

## 2022-10-18 MED ORDER — LIDOCAINE HCL (PF) 1 % IJ SOLN
INTRAMUSCULAR | Status: AC
  Filled 2022-10-18: qty 30

## 2022-10-18 MED ORDER — ASPIRIN 81 MG PO TBEC
81.0000 mg | DELAYED_RELEASE_TABLET | Freq: Every day | ORAL | Status: DC
  Administered 2022-10-19: 81 mg via ORAL
  Filled 2022-10-18: qty 1

## 2022-10-18 MED ORDER — THIAMINE MONONITRATE 100 MG PO TABS
100.0000 mg | ORAL_TABLET | Freq: Every day | ORAL | Status: DC
  Administered 2022-10-19: 100 mg via ORAL
  Filled 2022-10-18: qty 1

## 2022-10-18 MED ORDER — HEPARIN SODIUM (PORCINE) 1000 UNIT/ML IJ SOLN
INTRAMUSCULAR | Status: DC | PRN
  Administered 2022-10-18: 5000 [IU] via INTRAVENOUS

## 2022-10-18 MED ORDER — SODIUM CHLORIDE 0.9 % IV SOLN: INTRAVENOUS | Status: AC

## 2022-10-18 MED ORDER — HEPARIN (PORCINE) IN NACL 1000-0.9 UT/500ML-% IV SOLN
INTRAVENOUS | Status: AC
  Filled 2022-10-18: qty 1000

## 2022-10-18 MED ORDER — LORAZEPAM 2 MG/ML IJ SOLN: 0.0000 mg | Freq: Two times a day (BID) | INTRAMUSCULAR | Status: DC

## 2022-10-18 MED ORDER — ASPIRIN 81 MG PO CHEW
324.0000 mg | CHEWABLE_TABLET | Freq: Once | ORAL | Status: DC
  Filled 2022-10-18: qty 4

## 2022-10-18 SURGICAL SUPPLY — 16 items
CATH INFINITI 5 FR JL3.5 (CATHETERS) IMPLANT
CATH INFINITI JR4 5F (CATHETERS) IMPLANT
CATH OPTITORQUE TIG 4.0 6F (CATHETERS) IMPLANT
DEVICE RAD COMP TR BAND LRG (VASCULAR PRODUCTS) IMPLANT
GLIDESHEATH SLEND SS 6F .021 (SHEATH) IMPLANT
GUIDEWIRE INQWIRE 1.5J.035X260 (WIRE) IMPLANT
INQWIRE 1.5J .035X260CM (WIRE) ×1
KIT ENCORE 26 ADVANTAGE (KITS) IMPLANT
KIT HEART LEFT (KITS) ×1 IMPLANT
PACK CARDIAC CATHETERIZATION (CUSTOM PROCEDURE TRAY) ×1 IMPLANT
PROTECTION STATION PRESSURIZED (MISCELLANEOUS) ×1
SHEATH PROBE COVER 6X72 (BAG) IMPLANT
STATION PROTECTION PRESSURIZED (MISCELLANEOUS) IMPLANT
STOPCOCK MORSE 400PSI 3WAY (MISCELLANEOUS) IMPLANT
TRANSDUCER W/STOPCOCK (MISCELLANEOUS) ×1 IMPLANT
TUBING CIL FLEX 10 FLL-RA (TUBING) ×1 IMPLANT

## 2022-10-18 NOTE — ED Notes (Signed)
Pt approached nurses station requesting Tylenol for HA rating 4/10. Tylenol given. No other complaints voiced or noted. Patient A&Ox4. Denies intent to harm self/others when asked. Denies A/VH. Routine safety checks conducted according to facility protocol. Encouraged patient to notify staff if thoughts of harm toward self or others arise. Patient verbalize understanding and agreement. Will continue to monitor for safety.

## 2022-10-18 NOTE — ED Provider Notes (Signed)
[facility] EMERGENCY DEPARTMENT Provider Note   CSN: [id number] Arrival date & time: 10/18/22  1410     History  Chief Complaint  Patient presents with   Nausea    Jerry Castillo is a 57 y.o. male.  HPI 57 yo male ho hypertension etoh abuse, presented to [facility] with alcohol abuse. He reports last alcohol at 2 AM on December 31.  He states he has had ongoing epigastric discomfort and nausea. Patient sent to ED from behavioral health urgent care with reports of QT prolongation and elevated troponin. Patient denies any prior history of MI or coronary artery disease.    Home Medications Prior to Admission medications   Medication Sig Start Date End Date Taking? Authorizing Provider  albuterol (VENTOLIN HFA) 108 (90 Base) MCG/ACT inhaler Inhale 2 puffs into the lungs every 6 (six) hours as needed for wheezing or shortness of breath.    [provider]  doxycycline (VIBRA-TABS) 100 MG tablet Take 100 mg by mouth 2 (two) times daily. 10/07/22   [provider]  lisinopril (ZESTRIL) 20 MG tablet Take 20 mg by mouth daily.    [provider]  omeprazole (PRILOSEC) 40 MG capsule Take 40 mg by mouth daily. 10/16/22   [provider]  zolpidem (AMBIEN) 10 MG tablet Take 10 mg by mouth at bedtime as needed for sleep. 09/19/22   [provider]      Allergies    Penicillins    Review of Systems   Review of Systems  Physical Exam Updated Vital Signs BP (!) 153/87 (BP Location: Right Arm)   Pulse 87   Temp 98.1 F (36.7 C) (Oral)   Resp 16   Ht 1.651 m (5\' 5" )   Wt 83.9 kg   SpO2 98%   BMI 30.79 kg/m  Physical Exam Vitals reviewed.  Constitutional:      Appearance: Normal appearance. He is obese.  HENT:     Head: Normocephalic.     Right Ear: External ear normal.     Left Ear: External ear normal.     Nose: Nose normal.     Mouth/Throat:     Pharynx: Oropharynx is clear.  Eyes:     Pupils: Pupils are equal,  round, and reactive to light.  Cardiovascular:     Rate and Rhythm: Normal rate and regular rhythm.     Pulses: Normal pulses.  Pulmonary:     Effort: Pulmonary effort is normal.  Abdominal:     General: Abdomen is flat. Bowel sounds are normal.     Palpations: Abdomen is soft.  Musculoskeletal:        General: Normal range of motion.     Cervical back: Normal range of motion.  Skin:    General: Skin is warm.     Capillary Refill: Capillary refill takes less than 2 seconds.  Neurological:     General: No focal deficit present.     Mental Status: He is alert.  Psychiatric:        Mood and Affect: Mood normal.     ED Results / Procedures / Treatments   Labs (all labs ordered are listed, but only abnormal results are displayed) Labs Reviewed  HEMOGLOBIN A1C  CBC WITH DIFFERENTIAL/PLATELET  PROTIME-INR  APTT  COMPREHENSIVE METABOLIC PANEL  LIPID PANEL  TROPONIN I (HIGH SENSITIVITY)    EKG EKG Interpretation  Date/Time:  Monday October 18 2022 14:50:44 EST Ventricular Rate:  89 PR Interval:  148 QRS  Duration: 156 QT Interval:  417 QTC Calculation: 508 R Axis:   98 Text Interpretation: Sinus rhythm Probable left atrial enlargement RBBB and LPFB Lateral infarct, acute >>> Acute MI <<< Confirmed by Pattricia Boss [phone]) on 10/18/2022 2:55:01 PM  Radiology No results found.  Procedures Procedures    Medications Ordered in ED Medications  0.9 %  sodium chloride infusion (has no administration in time range)  aspirin chewable tablet 324 mg (324 mg Oral Not Given 10/18/22 1506)  heparin injection 4,000 Units (4,000 Units Intravenous Given 10/18/22 1505)    ED Course/ Medical Decision Making/ A&P                           Medical Decision Making  57 year old male history of hypertension presents today from [facility] with reports that he has QT prolongation and troponin elevation.  Patient with some nausea and epigastric discomfort.  No chest pain.  Reviewed EKGs from [facility].   Patient with September 30, 2020 with no bundle branch block and no ST elevation EKG obtained October 17, 2022 at 1833 shows right bundle branch block sinus tachycardia ST depression T wave inversion EKG reviewed October 18, 2018 40857 shows right bundle branch block with ST elevation in V4 V5 V6 with reciprocal changes in the inferior leads Patient initiated as code STEMI Code STEMI orders initiated with aspirin and heparin         Final Clinical Impression(s) / ED Diagnoses Final diagnoses:  ST elevation myocardial infarction (STEMI), unspecified artery ([place])  Nausea    Rx / DC Orders ED Discharge Orders     None         Pattricia Boss, MD 10/18/22 1507

## 2022-10-18 NOTE — ED Notes (Signed)
Cardiology at bedside.

## 2022-10-18 NOTE — ED Notes (Signed)
Pt with elevated Troponin level. [facility] called per Dr. Earley Favor. Pt made aware and agree to transport. Report called to charge nurse, Roselyn Reef of pt's arrival. EMS on site for transfer. Pt belongings given to pt. Pt clinically stable at departure.

## 2022-10-18 NOTE — Discharge Instructions (Signed)
[facility] free help, from public health agencies, to find substance use treatment and information. Learn more [phone]  [facility] Address: [address] [place], [place] [address] Hours: Maxwell Caul to 5PM Phone: [phone] Fax: [phone]  Services Include: Substance Abuse Outpatient Treatment Mental Health Outpatient Treatment Psychiatric Services/Medical Services Emergency Services ([facility]) Intensive In-Home Services DWI/DWLR Services  [facility] [address], [place], [place] [address] [phone]

## 2022-10-18 NOTE — ED Notes (Signed)
Pt awake, awaiting breakfast. No noted resp distress. Will monitor for safety

## 2022-10-18 NOTE — ED Provider Notes (Signed)
FBC/OBS ASAP Discharge Summary  Date and Time: 10/18/2022 2:37 PM  Name: Jerry Castillo  MRN:  [id number]   Discharge Diagnoses:  Final diagnoses:  Alcohol withdrawal syndrome without complication (HCC)  Alcohol use    Subjective:  Jerry Castillo is a 57 year old male patient with no past psychiatric history who presented to the [facility] behavioral health urgent care voluntary accompanied by his family requesting alcohol detox, for which he was admitted to [facility].     On assessment this AM, patient reports feeling nauseous as well as some dizziness upon standing and intermittent headache. He also endorsed a brief episode of palpitations this morning but denies all other withdrawal symptoms including tremulousness, emesis, and diarrhea. Patient denies SI, HI, and AVH today. Patient is motivated to discontinue alcohol use by attending meetings that previously helped him maintain his sobriety for 5 years. He does not want IOP, residential treatment at this time. He is open to receiving outpatient resources.   Stay Summary: Patient was admitted and started on CIWA/COWS monitoring with  Ativan taper and Clonidine 0.1 mg PRN COWS >9. He had a prolonged QTC interval of 510, that 517 on repeat. Electrolytes were WNL, and all QTC-prolonging agents were withheld. On repeat EKG the following AM, QTC was 560. BNP and Troponin obtained with the latter elevated to 74.  He was sent to [facility] ED for further evaluation and treatment.   Total Time spent with patient: 30 minutes  Past Psychiatric History: See H&P Past Medical History:  Past Medical History:  Diagnosis Date   GERD (gastroesophageal reflux disease)    Heart murmur    as a child   History of seasonal allergies    Hypertension    Pleurisy    Pneumonia    Scoliosis    Sleep apnea    uses a mouth guard, unable to tolerate CPAP    Past Surgical History:  Procedure Laterality Date   BACK SURGERY     x3 for scoliosis   HERNIA  REPAIR     x2   INSERTION OF MESH N/A 10/02/2020   Procedure: INSERTION OF MESH;  Surgeon: Axel Filler, MD;  Location: [facility] OR;  Service: General;  Laterality: N/A;   LEG SURGERY Right    repair - plates and screws   MANDIBLE FRACTURE SURGERY     NASAL SINUS SURGERY     VENTRAL HERNIA REPAIR  10/02/2020   VENTRAL HERNIA REPAIR N/A 10/02/2020   Procedure: LAPAROSCOPIC VENTRAL HERNIA REPAIR WITH MESH;  Surgeon: Axel Filler, MD;  Location: [facility] OR;  Service: General;  Laterality: N/A;   Family History: History reviewed. No pertinent family history. Family Psychiatric History: See H&P Social History:  Social History   Substance and Sexual Activity  Alcohol Use Not Currently     Social History   Substance and Sexual Activity  Drug Use Not Currently    Social History   Socioeconomic History   Marital status: Divorced    Spouse name: Not on file   Number of children: Not on file   Years of education: Not on file   Highest education level: Not on file  Occupational History   Not on file  Tobacco Use   Smoking status: Never   Smokeless tobacco: Current    Types: Snuff  Vaping Use   Vaping Use: Never used  Substance and Sexual Activity   Alcohol use: Not Currently   Drug use: Not Currently   Sexual activity: Not on file  Other Topics Concern   Not on file  Social History Narrative   Not on file   Social Determinants of Health   Financial Resource Strain: Not on file  Food Insecurity: Not on file  Transportation Needs: Not on file  Physical Activity: Not on file  Stress: Not on file  Social Connections: Not on file   SDOH:  SDOH Screenings   Depression (PHQ2-9): Medium Risk (10/17/2022)  Tobacco Use: High Risk (10/17/2022)    Tobacco Cessation:  A prescription for an FDA-approved tobacco cessation medication was offered at discharge and the patient refused  Current Medications:  No current facility-administered medications for this encounter.    Current Outpatient Medications  Medication Sig Dispense Refill   doxycycline (VIBRA-TABS) 100 MG tablet Take 100 mg by mouth 2 (two) times daily.     omeprazole (PRILOSEC) 40 MG capsule Take 40 mg by mouth daily.     zolpidem (AMBIEN) 10 MG tablet Take 10 mg by mouth at bedtime as needed for sleep.     albuterol (VENTOLIN HFA) 108 (90 Base) MCG/ACT inhaler Inhale 2 puffs into the lungs every 6 (six) hours as needed for wheezing or shortness of breath.     lisinopril (ZESTRIL) 20 MG tablet Take 20 mg by mouth daily.      PTA Medications: (Not in a hospital admission)      10/17/2022    2:42 PM  Depression screen PHQ 2/9  Decreased Interest 1  Down, Depressed, Hopeless 1  PHQ - 2 Score 2  Altered sleeping 1  Tired, decreased energy 1  Change in appetite 2  Feeling bad or failure about yourself  2  Trouble concentrating 1  Moving slowly or fidgety/restless 0  Suicidal thoughts 0  PHQ-9 Score 9  Difficult doing work/chores Somewhat difficult    Flowsheet Row ED from 10/18/2022 in [place] ED from 10/17/2022 in [facility] No Risk No Risk       Musculoskeletal  Strength & Muscle Tone: within normal limits Gait & Station: normal Patient leans: N/A  Psychiatric Specialty Exam  Presentation  General Appearance:  Appropriate for Environment  Eye Contact: Fair  Speech: Clear and Coherent  Speech Volume: Normal  Handedness:No data recorded  Mood and Affect  Mood: Anxious  Affect: Congruent   Thought Process  Thought Processes: Coherent; Goal Directed  Descriptions of Associations:Intact  Orientation:Full (Time, Place and Person)  Thought Content:Logical  Diagnosis of Schizophrenia or Schizoaffective disorder in past: No    Hallucinations:Hallucinations: None  Ideas of Reference:None  Suicidal Thoughts:Suicidal Thoughts: No  Homicidal Thoughts:Homicidal  Thoughts: No   Sensorium  Memory: Immediate Fair; Recent Fair; Remote Fair  Judgment: Fair  Insight: Fair   Community education officer  Concentration: Fair  Attention Span: Fair  Recall: [employer] of Knowledge: Fair  Language: Fair   Psychomotor Activity  Psychomotor Activity: Psychomotor Activity: Normal   Assets  Assets: Communication Skills; Desire for Improvement; Financial Resources/Insurance; Housing; Leisure Time; Physical Health; Social Support; Transportation   Sleep  Sleep: Sleep: Fair Number of Hours of Sleep: 8   Nutritional Assessment (For OBS and FBC admissions only) Has the patient had a weight loss or gain of 10 pounds or more in the last 3 months?: No Has the patient had a decrease in food intake/or appetite?: No Does the patient have dental problems?: No Does the patient have eating habits or behaviors that may be indicators of an  eating disorder including binging or inducing vomiting?: No Has the patient recently lost weight without trying?: 0 Has the patient been eating poorly because of a decreased appetite?: 0 Malnutrition Screening Tool Score: 0    Physical Exam  Vitals reviewed.  HENT:     Head: Normocephalic and atraumatic.     Mouth/Throat:     Mouth: Mucous membranes are moist.     Pharynx: Oropharynx is clear.  Pulmonary:     Effort: Pulmonary effort is normal.  Skin:    General: Skin is warm and dry.  Neurological:     General: No focal deficit present.     Mental Status: He is alert. Mental status is at baseline.    Review of Systems  Gastrointestinal:  Positive for nausea. Negative for abdominal pain, diarrhea and vomiting.  Genitourinary: Negative.   Musculoskeletal: Negative.   Neurological:  Positive for dizziness and headaches. Negative for tremors, seizures and weakness.  Blood pressure (!) 160/93, pulse (!) 109, temperature 97.9 F (36.6 C), resp. rate 18, SpO2 99 %. There is no height or weight on file to  calculate BMI.  Demographic Factors:  Male and Caucasian  Loss Factors: Decline in physical health  Historical Factors: NA  Risk Reduction Factors:   Sense of responsibility to family, Employed, Living with another person, especially a relative, and Positive social support  Continued Clinical Symptoms:  Alcohol/Substance Abuse/Dependencies  Cognitive Features That Contribute To Risk:  None    Suicide Risk:  Mild: There are no identifiable plans, no associated intent, mild dysphoria and related symptoms, good self-control (both objective and subjective assessment), few other risk factors, and identifiable protective factors, including available and accessible social support.  Plan Of Care/Follow-up recommendations:  Follow-up recommendations:  Activity:  Normal, as tolerated Diet:  Per PCP recommendation  Patient is instructed prior to discharge to: Take all medications as prescribed by his mental healthcare provider. Report any adverse effects and/or reactions from the medicines to his outpatient provider promptly. Patient has been instructed & cautioned: To not engage in alcohol and or illegal drug use while on prescription medicines.  In the event of worsening symptoms, patient is instructed to call the crisis hotline at 988, 911 and or go to the nearest ED for appropriate evaluation and treatment of symptoms. To follow-up with his primary care provider for your other medical issues, concerns and or health care needs.   Disposition: [facility]. Recommend to finish Ativan taper with one 1 mg dose given tonight and one 1 mg dose given tomorrow. Patient is otherwise psychiatrically cleared and may discharge from [facility] ED once medically stable.  Rosezetta Schlatter, MD 10/18/2022, 2:37 PM

## 2022-10-18 NOTE — Progress Notes (Signed)
Chaplain responded to STEMI.  Introduced services and offered support.  Pt requested his mother, Jourdyn Ferrin, be advised that he has come to the hospital to be checked out.  TEL:  [phone]. Chaplain has attempted to reach her multiple times, no answer, no machine, land line, so no text. Chaplain will attempt again.  Please contact if pt requests or would benefit from follow-up support.   Minus Liberty, [place] Pager:  [phone]    10/18/22 1455  Clinical Encounter Type  Visited With Patient  Visit Type Initial;Code  Referral From Physician  Consult/Referral To Chaplain  Spiritual Encounters  Spiritual Needs Emotional  Stress Factors  Patient Stress Factors Health changes;Family relationships

## 2022-10-18 NOTE — ED Notes (Signed)
Pt eating lunch quietly and watching tv. Minimal interaction noted with peers. Denies needs or concerns at present. Informed pt to notify staff with any needs. Pt verbalized understanding and agreement. Will continue to monitor for safety.

## 2022-10-18 NOTE — Tx Team (Signed)
LCSW met with patient to assess current mood, affect, physical state, and inquire about needs/goals while here in [facility] and after discharge. Patient reports he presented via Family car due to needing to detox. Patient reports he has been living with his parents due to their current medical conditions. Patient identified recent change to his parent's health, having a recent job change, and increase in substance use as he current stressors. Patient reports being on a 11-day vacation and reports due to boredom and all the stressors of caring for his parents, he decided to have a few drinks however reports "over doing it". Patient reports he started off drinking about 3-4 beers a day, however has now increased to hard liquor and reports drinking about a 5th a day for the last week. Patient reports  he has been drinking and using substance for the past 2-3 months. Patient reports prior to two-three months ago, he had a five year period of sobriety. Patient reports he has been using pain pills, suboxone, and alcohol recently. Patient reports he has been buying suboxone off the streets after being told it would help when he does not have access to his pain pills. Patient reports his pain pills are prescribed by his PCP. Patient denies a need for treatment at this time, and reports he his goal is to return back to work on tomorrow or Wednesday. Patient reports currently attends a weekly AA meeting with a family friend. Patient reports a prior history of inpatient substance abuse treatment for 30 days and reports having great success. Patient reports he is not interested in any outpatient resources at this time, however is not opposed to LCSW providing resources in discharge paperwork. Patient reports he is hoping to just detox and return back to work. Patient aware that update will be provided to MD, and information will be provided in AVS for  resources. Patient expressed understanding and appreciation of LCSW  assistance. No other needs were reported at this time by patient.    Lucius Conn, LCSW Clinical Social Worker [place] [facility] Ph: [phone]

## 2022-10-18 NOTE — H&P (Addendum)
Cardiology Admission History and Physical   Patient ID: Jerry Castillo MRN: [id number]; DOB: [birth year]   Admission date: 10/18/2022  PCP:  [facility]    HeartCare Providers Cardiologist:  None        Chief Complaint:  chest pain  Patient Profile:   Jerry Castillo is a 57 y.o. male with HTN, GERD, hx murmur, hernia repair 2021, and ETOH use who is being seen 10/18/2022 for the evaluation of chest pain with abnormal EKG.  History of Present Illness:   Jerry Castillo with above hx was at [facility] for ETOH withdrawal and developed chest pain (described as epigastric discomfort and nausea).  Last ETOH was 10/17/22 at 0200.   His troponin was reported elevated and RBBB on EKG.   RBBB new from 2021.    BNP 64.7  hs troponin 74 WBC 7.2 Hgb 13.9 plts 180 CMP pending.  EKG:  The ECG that was done 10/17/22 was personally reviewed and demonstrates ST at 123 and RBBB.  Today EKG SR 89 with RBBB and ST elevation V 5-6    Past Medical History:  Diagnosis Date   GERD (gastroesophageal reflux disease)    Heart murmur    as a child   History of seasonal allergies    Hypertension    Pleurisy    Pneumonia    Scoliosis    Sleep apnea    uses a mouth guard, unable to tolerate CPAP    Past Surgical History:  Procedure Laterality Date   BACK SURGERY     x3 for scoliosis   HERNIA REPAIR     x2   INSERTION OF MESH N/A 10/02/2020   Procedure: INSERTION OF MESH;  Surgeon: Axel Filler, MD;  Location: [facility] OR;  Service: General;  Laterality: N/A;   LEG SURGERY Right    repair - plates and screws   MANDIBLE FRACTURE SURGERY     NASAL SINUS SURGERY     VENTRAL HERNIA REPAIR  10/02/2020   VENTRAL HERNIA REPAIR N/A 10/02/2020   Procedure: LAPAROSCOPIC VENTRAL HERNIA REPAIR WITH MESH;  Surgeon: Axel Filler, MD;  Location: [facility] OR;  Service: General;  Laterality: N/A;     Medications Prior to Admission: Prior to Admission medications   Medication Sig Start  Date End Date Taking? Authorizing Provider  albuterol (VENTOLIN HFA) 108 (90 Base) MCG/ACT inhaler Inhale 2 puffs into the lungs every 6 (six) hours as needed for wheezing or shortness of breath.    [provider]  doxycycline (VIBRA-TABS) 100 MG tablet Take 100 mg by mouth 2 (two) times daily. 10/07/22   [provider]  lisinopril (ZESTRIL) 20 MG tablet Take 20 mg by mouth daily.    [provider]  omeprazole (PRILOSEC) 40 MG capsule Take 40 mg by mouth daily. 10/16/22   [provider]  zolpidem (AMBIEN) 10 MG tablet Take 10 mg by mouth at bedtime as needed for sleep. 09/19/22   [provider]     Allergies:    Allergies  Allergen Reactions   Penicillins Anaphylaxis and Swelling    Throat swelling    Social History:   Social History   Socioeconomic History   Marital status: Divorced    Spouse name: Not on file   Number of children: Not on file   Years of education: Not on file   Highest education level: Not on file  Occupational History   Not on file  Tobacco Use   Smoking status:  Never   Smokeless tobacco: Current    Types: Snuff  Vaping Use   Vaping Use: Never used  Substance and Sexual Activity   Alcohol use: Not Currently   Drug use: Not Currently   Sexual activity: Not on file  Other Topics Concern   Not on file  Social History Narrative   Not on file   Social Determinants of Health   Financial Resource Strain: Not on file  Food Insecurity: Not on file  Transportation Needs: Not on file  Physical Activity: Not on file  Stress: Not on file  Social Connections: Not on file  Intimate Partner Violence: Not on file    Family History:   The patient's family history is not on file.    ROS:  Please see the history of present illness.  General:no colds or fevers, no weight changes Skin:no rashes or ulcers HEENT:no blurred vision, no congestion CV:see HPI PUL:see HPI GI:no diarrhea constipation or melena, ?  Indigestion vs cardiac pain GU:no hematuria, no dysuria MS:no joint pain, no claudication Neuro:no syncope, no lightheadedness Endo:no diabetes, no thyroid disease  All other ROS reviewed and negative.     Physical Exam/Data:   Vitals:   10/18/22 1430 10/18/22 1500 10/18/22 1530 10/18/22 1554  BP: (!) 153/87 (!) 141/90 (!) 145/95   Pulse: 87 85 91   Resp: 16 (!) 21 17   Temp: 98.1 F (36.7 C)     TempSrc: Oral     SpO2: 98% 100% 98% 98%  Weight:      Height:       No intake or output data in the 24 hours ending 10/18/22 1603    10/18/2022    2:20 PM 10/02/2020    8:30 AM 09/30/2020    9:44 AM  Last 3 Weights  Weight (lbs) 185 lb 191 lb 14.4 oz 191 lb 14.4 oz  Weight (kg) 83.915 kg 87.045 kg 87.045 kg     Body mass index is 30.79 kg/m.   Exam per Dr. Tresa Endo  General:  Well nourished, well developed, in no acute distress though with discomfort HEENT: normal Neck: no JVD Vascular: No carotid bruits; Distal pulses 2+ bilaterally   Cardiac:  normal S1, S2; RRR; no murmur gallup rub or click Lungs:  clear to auscultation bilaterally, no wheezing, rhonchi or rales  Abd: soft, nontender, no hepatomegaly  Ext: no lower ext edema Musculoskeletal:  No deformities, BUE and BLE strength normal and equal Skin: warm and dry  Neuro:  CNs 2-12 intact, no focal abnormalities noted Psych:  Normal affect     Relevant CV Studies: None  Echo ordered  Laboratory Data:  High Sensitivity Troponin:   Recent Labs  Lab 10/18/22 1110  TROPONINIHS 74*      Chemistry Recent Labs  Lab 10/17/22 1337  NA 136  K 3.8  CL 89*  CO2 23  GLUCOSE 138*  BUN 23*  CREATININE 1.17  CALCIUM 8.7*  MG 2.5*  GFRNONAA >60  ANIONGAP 24*    Recent Labs  Lab 10/17/22 1337  PROT 7.6  ALBUMIN 4.4  AST 126*  ALT 101*  ALKPHOS 67  BILITOT 0.9   Lipids  Recent Labs  Lab 10/17/22 1337  CHOL 282*  TRIG 184*  HDL 86  LDLCALC 159*  CHOLHDL 3.3   Hematology Recent Labs  Lab  10/17/22 1337 10/18/22 1453  WBC 8.7 7.2  RBC 5.14 4.24  HGB 16.9 13.9  HCT 45.8 38.7*  MCV 89.1 91.3  MCH  32.9 32.8  MCHC 36.9* 35.9  RDW 11.7 11.7  PLT 271 180   Thyroid  Recent Labs  Lab 10/17/22 1337  TSH 0.554   BNP Recent Labs  Lab 10/18/22 1110  BNP 64.7    DDimer No results for input(s): "DDIMER" in the last 168 hours.   Radiology/Studies:  DG Chest Port 1 View  Result Date: 10/18/2022 CLINICAL DATA:  Epigastric discomfort and abnormal EKG. EXAM: PORTABLE CHEST 1 VIEW COMPARISON:  04/18/2007 FINDINGS: The heart size appears normal. No pleural effusion or interstitial edema. No airspace opacities identified. Unchanged thoracolumbar scoliosis. IMPRESSION: No active disease. Electronically Signed   By: Kerby Moors M.D.   On: 10/18/2022 15:13     Assessment and Plan:   STEMI with ST elevation in lateral leads and hs Troponin 74.  Taken emergently to the cath lab for cardiac cath, admit to ICU depending on cath, check Echo, add lipitor 80, BB ASA  HTN on zestril as outpt  ETOH use - will add ativan if needed GERD use PPI     Risk Assessment/Risk Scores:    TIMI Risk Score for ST  Elevation MI:   The patient's TIMI risk score is 2, which indicates a 2.2% risk of all cause mortality at 30 days.   New York Heart Association (NYHA) Functional Class NYHA Class I     Severity of Illness: The appropriate patient status for this patient is INPATIENT. Inpatient status is judged to be reasonable and necessary in order to provide the required intensity of service to ensure the patient's safety. The patient's presenting symptoms, physical exam findings, and initial radiographic and laboratory data in the context of their chronic comorbidities is felt to place them at high risk for further clinical deterioration. Furthermore, it is not anticipated that the patient will be medically stable for discharge from the hospital within 2 midnights of admission.   * I certify  that at the point of admission it is my clinical judgment that the patient will require inpatient hospital care spanning beyond 2 midnights from the point of admission due to high intensity of service, high risk for further deterioration and high frequency of surveillance required.*   For questions or updates, please contact [place] Please consult [email or web address] for contact info under     Signed, Cecilie Kicks, NP  10/18/2022 4:03 PM    Patient seen and examined. Agree with assessment and plan.  Mr. Jerry Castillo is a 57 year old divorced male who has a history of hypertension, GERD, remote hernia repair, and EtOH use.  He was recently admitted to behavioral health as result of significant recent EtOH ingestion over the past several months.  Recently he would typically drink 3 beers on the weekend but more recently he was essentially drinking 1/5 of alcohol per day.  He had experienced nausea.  He also had some vague epigastric sensation.  He was found to have new right bundle branch block.  An ECG today showed development of 2 - 3 mm ST elevation in leads V5 and V6.  He presented to the emergency room.  A code STEMI was activated.  Examined the patient in the emergency room.  At time he still having some nausea but chest pain or epigastric discomfort had resolved.  BP was  was elevated.  HEENT was unremarkable.  There was no JVD.  Lungs were clear.  Abdomen was nontender.  Pulses were 2+.  There was no significant edema.  With his ECG concerning for  possible anterolateral STEMI and initially elevated troponin at 74, the code STEMI had been activated.  I discussed the benefits in detail of the cardiac catheterization with possible need for intervention and the patient agreed to have the study performed.  He was taken up to the Cath Lab for emergent catheterization and possible intervention.  Emergent catheterization demonstrated normal coronary arteries and essentially normal LV function.   Laboratories notable for elevation of liver function studies most likely due to his recent EtOH use.  Lipids are elevated a mixed hyperlipidemic pattern with cholesterol 282, triglycerides 184, and LDL cholesterol at 159.  Presently will defer initiation of statin therapy pending improvement of LFTs but he may need future repeat with possible initiation of ezetamide.  Pressure is elevated, will need optimal blood pressure control.   Troy Sine, MD, [facility] 10/18/2022 4:41 PM

## 2022-10-18 NOTE — ED Notes (Signed)
RN introduced self to pt, pt alert and oriented, denying chest pain, reporting recent illness, primary complaint nausea. Pt placed on zoll pads. Heparin given.

## 2022-10-18 NOTE — ED Notes (Addendum)
Pt was awake in dayroom, requesting food. He returned to room. No c/o VH, nausea, or anxiety. Will continue to monitor for safety

## 2022-10-18 NOTE — ED Triage Notes (Signed)
Pt BIBA for c/o nausea and elevated trop. Pt came from [facility] where he was for ETOH withdrawal. Pt is A&Ox4, received 324 ASA. EKG showed RBBB per EMS. Pt denies chest pain

## 2022-10-19 ENCOUNTER — Inpatient Hospital Stay (HOSPITAL_BASED_OUTPATIENT_CLINIC_OR_DEPARTMENT_OTHER): Payer: BC Managed Care – PPO

## 2022-10-19 ENCOUNTER — Encounter (HOSPITAL_COMMUNITY): Payer: Self-pay | Admitting: Cardiovascular Disease

## 2022-10-19 DIAGNOSIS — R079 Chest pain, unspecified: Secondary | ICD-10-CM

## 2022-10-19 DIAGNOSIS — I213 ST elevation (STEMI) myocardial infarction of unspecified site: Secondary | ICD-10-CM

## 2022-10-19 DIAGNOSIS — Z79899 Other long term (current) drug therapy: Secondary | ICD-10-CM | POA: Diagnosis not present

## 2022-10-19 DIAGNOSIS — R9431 Abnormal electrocardiogram [ECG] [EKG]: Secondary | ICD-10-CM | POA: Diagnosis not present

## 2022-10-19 DIAGNOSIS — R7989 Other specified abnormal findings of blood chemistry: Secondary | ICD-10-CM | POA: Insufficient documentation

## 2022-10-19 DIAGNOSIS — R110 Nausea: Secondary | ICD-10-CM | POA: Diagnosis not present

## 2022-10-19 DIAGNOSIS — E785 Hyperlipidemia, unspecified: Secondary | ICD-10-CM | POA: Insufficient documentation

## 2022-10-19 LAB — HEMOGLOBIN A1C
Hgb A1c MFr Bld: 5.8 % — ABNORMAL HIGH (ref 4.8–5.6)
Hgb A1c MFr Bld: 5.8 % — ABNORMAL HIGH (ref 4.8–5.6)
Hgb A1c MFr Bld: 5.8 % — ABNORMAL HIGH (ref 4.8–5.6)
Mean Plasma Glucose: 120 mg/dL
Mean Plasma Glucose: 120 mg/dL
Mean Plasma Glucose: 120 mg/dL

## 2022-10-19 LAB — BASIC METABOLIC PANEL
Anion gap: 10 (ref 5–15)
BUN: 15 mg/dL (ref 6–20)
CO2: 29 mmol/L (ref 22–32)
Calcium: 8.8 mg/dL — ABNORMAL LOW (ref 8.9–10.3)
Chloride: 95 mmol/L — ABNORMAL LOW (ref 98–111)
Creatinine, Ser: 1.1 mg/dL (ref 0.61–1.24)
GFR, Estimated: >60 mL/min (ref >60)
Glucose, Bld: 122 mg/dL — ABNORMAL HIGH (ref 70–99)
Potassium: 3.8 mmol/L (ref 3.5–5.1)
Sodium: 134 mmol/L — ABNORMAL LOW (ref 135–145)

## 2022-10-19 LAB — CBC
HCT: 37.2 % — ABNORMAL LOW (ref 39.0–52.0)
Hemoglobin: 12.7 g/dL — ABNORMAL LOW (ref 13.0–17.0)
MCH: 32.2 pg (ref 26.0–34.0)
MCHC: 34.1 g/dL (ref 30.0–36.0)
MCV: 94.2 fL (ref 80.0–100.0)
Platelets: 174 10*3/uL (ref 150–400)
RBC: 3.95 MIL/uL — ABNORMAL LOW (ref 4.22–5.81)
RDW: 11.7 % (ref 11.5–15.5)
WBC: 5.7 10*3/uL (ref 4.0–10.5)
nRBC: 0 % (ref 0.0–0.2)

## 2022-10-19 LAB — HEPATIC FUNCTION PANEL
ALT: 68 U/L — ABNORMAL HIGH (ref 0–44)
AST: 75 U/L — ABNORMAL HIGH (ref 15–41)
Albumin: 3.2 g/dL — ABNORMAL LOW (ref 3.5–5.0)
Alkaline Phosphatase: 53 U/L (ref 38–126)
Bilirubin, Direct: 0.2 mg/dL (ref 0.0–0.2)
Indirect Bilirubin: 0.8 mg/dL (ref 0.3–0.9)
Total Bilirubin: 1 mg/dL (ref 0.3–1.2)
Total Protein: 5.5 g/dL — ABNORMAL LOW (ref 6.5–8.1)

## 2022-10-19 LAB — LIPID PANEL
Cholesterol: 216 mg/dL — ABNORMAL HIGH (ref 0–200)
HDL: 75 mg/dL (ref >40)
LDL Cholesterol: 115 mg/dL — ABNORMAL HIGH (ref 0–99)
Total CHOL/HDL Ratio: 2.9 RATIO
Triglycerides: 130 mg/dL (ref <150)
VLDL: 26 mg/dL (ref 0–40)

## 2022-10-19 LAB — ECHOCARDIOGRAM COMPLETE
Area-P 1/2: 2.76 cm2
Height: 65 in
S' Lateral: 2.6 cm
Weight: 2865.6 oz

## 2022-10-19 MED ORDER — ATORVASTATIN CALCIUM 40 MG PO TABS: 40.0000 mg | ORAL_TABLET | Freq: Every day | ORAL | 0 refills | Status: DC

## 2022-10-19 MED ORDER — METOPROLOL TARTRATE 12.5 MG HALF TABLET
12.5000 mg | ORAL_TABLET | Freq: Once | ORAL | Status: AC
  Administered 2022-10-19: 12.5 mg via ORAL
  Filled 2022-10-19: qty 1

## 2022-10-19 MED ORDER — METOPROLOL TARTRATE 25 MG PO TABS: 25.0000 mg | ORAL_TABLET | Freq: Two times a day (BID) | ORAL | Status: DC

## 2022-10-19 MED ORDER — ATORVASTATIN CALCIUM 40 MG PO TABS: 40.0000 mg | ORAL_TABLET | Freq: Every day | ORAL | Status: DC

## 2022-10-19 MED ORDER — POTASSIUM CHLORIDE CRYS ER 20 MEQ PO TBCR
20.0000 meq | EXTENDED_RELEASE_TABLET | Freq: Once | ORAL | Status: AC
  Administered 2022-10-19: 20 meq via ORAL
  Filled 2022-10-19: qty 1

## 2022-10-19 MED ORDER — METOPROLOL TARTRATE 25 MG PO TABS: 25.0000 mg | ORAL_TABLET | Freq: Two times a day (BID) | ORAL | 0 refills | Status: DC

## 2022-10-19 NOTE — Discharge Summary (Signed)
 Discharge Summary    Patient ID: Jerry Castillo MRN: [id number]; DOB: 01/10/1966  Admit date: 10/18/2022 Discharge date: 10/19/2022  PCP:  Pllc, [facility]   [facility] Providers Cardiologist: New, Dr. Kelly   Discharge Diagnoses    Principal Problem:   Abnormal EKG Active Problems:   Nonspecific abnormal electrocardiogram (ECG) (EKG)   Nausea   Chest pain at rest   Hyperlipidemia   Elevated LFTs  Diagnostic Studies/Procedures    Cath: 10/18/2022     The left ventricular systolic function is normal.   LV end diastolic pressure is normal.   Normal epicardial coronary arteries with right dominant coronary anatomy.   Normal LV systolic function with estimated EF approximately 55%.  LVEDP 13 mmHg.   RECOMMENDATION: Patient will be admitted to laboratory.  Obtain serial troponin.  He has been on lisinopril blood pressure control.  Will need to monitor LFTs which are elevated most likely due to recent excessive EtOH use.  Will obtain 2D echo Doppler study in a.m.  Lipid studies are elevated but at present defer statin therapy with elevated LFTs.    Echo: 10/19/2022  IMPRESSIONS     1. Left ventricular ejection fraction, by estimation, is 60 to 65%. Left  ventricular ejection fraction by PLAX is 65 %. The left ventricle has  normal function. The left ventricle has no regional wall motion  abnormalities. There is mild concentric left  ventricular hypertrophy of the basal-septal segment. Left ventricular  diastolic parameters are consistent with Grade I diastolic dysfunction  (impaired relaxation). The average left ventricular global longitudinal  strain is -17.2 %. The global  longitudinal strain is abnormal.   2. Right ventricular systolic function is normal. The right ventricular  size is normal. Tricuspid regurgitation signal is inadequate for assessing  PA pressure.   3. The mitral valve is myxomatous. No evidence of mitral valve   regurgitation. No evidence of mitral stenosis.   4. The aortic valve is tricuspid. Aortic valve regurgitation is trivial.  No aortic stenosis is present.   5. The inferior vena cava is normal in size with greater than 50%  respiratory variability, suggesting right atrial pressure of 3 mmHg.   FINDINGS   Left Ventricle: Left ventricular ejection fraction, by estimation, is 60  to 65%. Left ventricular ejection fraction by PLAX is 65 %. The left  ventricle has normal function. The left ventricle has no regional wall  motion abnormalities. The average left  ventricular global longitudinal strain is -17.2 %. The global longitudinal  strain is abnormal. The left ventricular internal cavity size was normal  in size. There is mild concentric left ventricular hypertrophy of the  basal-septal segment. Left  ventricular diastolic parameters are consistent with Grade I diastolic  dysfunction (impaired relaxation). Indeterminate filling pressures.   Right Ventricle: The right ventricular size is normal. No increase in  right ventricular wall thickness. Right ventricular systolic function is  normal. Tricuspid regurgitation signal is inadequate for assessing PA  pressure.   Left Atrium: Left atrial size was normal in size.   Right Atrium: Right atrial size was normal in size.   Pericardium: There is no evidence of pericardial effusion.   Mitral Valve: The mitral valve is myxomatous. No evidence of mitral valve  regurgitation. No evidence of mitral valve stenosis.   Tricuspid Valve: The tricuspid valve is grossly normal. Tricuspid valve  regurgitation is not demonstrated. No evidence of tricuspid stenosis.   Aortic Valve: The aortic valve is tricuspid.   Aortic valve regurgitation is  trivial. No aortic stenosis is present.   Pulmonic Valve: The pulmonic valve was grossly normal. Pulmonic valve  regurgitation is mild. No evidence of pulmonic stenosis.   Aorta: The aortic root and ascending  aorta are structurally normal, with  no evidence of dilitation.   Venous: The inferior vena cava is normal in size with greater than 50%  respiratory variability, suggesting right atrial pressure of 3 mmHg.   IAS/Shunts: No atrial level shunt detected by color flow Doppler.   _____________   History of Present Illness     Jerry Castillo is a 57 y.o. male with above hx was at [facility] for ETOH withdrawal and developed chest pain (described as epigastric discomfort and nausea).  Last ETOH was 10/17/22 at 0200. His troponin was reported elevated and RBBB on EKG. RBBB new from 2021.     BNP 64.7  hs troponin 74 WBC 7.2 Hgb 13.9 plts 180 CMP pending.   EKG:  The ECG that was done 10/17/22 was personally reviewed and demonstrates ST at 123 and RBBB.  The day of admission, EKG SR 89 with RBBB and ST elevation V 5-6   Hospital Course     Abnormal EKG -- presented to [facility] with plans for ETOH detox. Was noted to have abnormal EKG and concern for STEMI. He was taken to the cath lab with cardiac cath noted above normal coronaries. No chest pain.  -- echo showed LVEF of 60-65%, no rWMA, g1DD, normal RV size and function   ETOH Abuse Detox -- presented to [facility] 12/31, has been on CIWA here. Will touch base with IM about further recommendations for dispo   Elevated LFTs -- AST 126>>95>>75, ALT 101>>79>>68 -- atorvastatin 40mg daily to start 1/9  Hypertension -- blood pressures elevated at times -- increased metoprolol to 25mg BID prior to discharge   Patient was seen by Dr. Kelly and deemed stable for discharge home. Follow up arranged in the office. Medications sent to patients pharmacy.  Did the patient have an acute coronary syndrome (MI, NSTEMI, STEMI, etc) this admission?:  No                               Did the patient have a percutaneous coronary intervention (stent / angioplasty)?:  No.     _____________  Discharge Vitals Blood pressure (!) 151/81, pulse 74, temperature 98.1 F (36.7  C), temperature source Oral, resp. rate 19, height 5' 5" (1.651 m), weight 81.2 kg, SpO2 99 %.  Filed Weights   10/18/22 1420 10/18/22 1735 10/19/22 0411  Weight: 83.9 kg 82.2 kg 81.2 kg    Labs & Radiologic Studies    CBC Recent Labs    10/17/22 1337 10/18/22 1453 10/19/22 0118  WBC 8.7 7.2 5.7  NEUTROABS 6.4 4.4  --   HGB 16.9 13.9 12.7*  HCT 45.8 38.7* 37.2*  MCV 89.1 91.3 94.2  PLT 271 180 174   Basic Metabolic Panel Recent Labs    10/17/22 1337 10/18/22 1453 10/18/22 1801 10/19/22 0118  NA 136 133*  --  134*  K 3.8 3.4*  --  3.8  CL 89* 92*  --  95*  CO2 23 31  --  29  GLUCOSE 138* 108*  --  122*  BUN 23* 16  --  15  CREATININE 1.17 1.27*  --  1.10  CALCIUM 8.7* 9.1  --  8.8*  MG 2.5*  --    2.2  --    Liver Function Tests Recent Labs    10/18/22 1453 10/19/22 0118  AST 95* 75*  ALT 79* 68*  ALKPHOS 61 53  BILITOT 1.1 1.0  PROT 6.0* 5.5*  ALBUMIN 3.6 3.2*   No results for input(s): "LIPASE", "AMYLASE" in the last 72 hours. High Sensitivity Troponin:   Recent Labs  Lab 10/18/22 1110 10/18/22 1453 10/18/22 1801  TROPONINIHS 74* 56* 68*    BNP Invalid input(s): "POCBNP" D-Dimer No results for input(s): "DDIMER" in the last 72 hours. Hemoglobin A1C Recent Labs    10/18/22 1452  HGBA1C 5.8*   Fasting Lipid Panel Recent Labs    10/19/22 0119  CHOL 216*  HDL 75  LDLCALC 115*  TRIG 130  CHOLHDL 2.9   Thyroid Function Tests Recent Labs    10/18/22 1759  TSH 1.241   _____________  ECHOCARDIOGRAM COMPLETE  Result Date: 10/19/2022    ECHOCARDIOGRAM REPORT   Patient Name:   Jerry Castillo Date of Exam: 10/19/2022 Medical Rec #:  [id number]       Height:       65.0 in Accession #:    [id number]      Weight:       179.1 lb Date of Birth:  09/21/1966       BSA:          1.887 m Patient Age:    56 years        BP:           158/87 mmHg Patient Gender: M               HR:           78 bpm. Exam Location:  Inpatient Procedure: 2D Echo, Cardiac  Doppler, Color Doppler, 3D Echo and Strain Analysis Indications:    Acute myocardial infarction, unspecified I21.9  History:        Patient has no prior history of Echocardiogram examinations.                 Arrythmias:RBBB, Signs/Symptoms:Murmur; Risk                 Factors:Hypertension and Sleep Apnea. GERD.  Sonographer:    Tiffany Cooper RDCS Referring Phys: [id number] LAURA R INGOLD IMPRESSIONS  1. Left ventricular ejection fraction, by estimation, is 60 to 65%. Left ventricular ejection fraction by PLAX is 65 %. The left ventricle has normal function. The left ventricle has no regional wall motion abnormalities. There is mild concentric left ventricular hypertrophy of the basal-septal segment. Left ventricular diastolic parameters are consistent with Grade I diastolic dysfunction (impaired relaxation). The average left ventricular global longitudinal strain is -17.2 %. The global longitudinal strain is abnormal.  2. Right ventricular systolic function is normal. The right ventricular size is normal. Tricuspid regurgitation signal is inadequate for assessing PA pressure.  3. The mitral valve is myxomatous. No evidence of mitral valve regurgitation. No evidence of mitral stenosis.  4. The aortic valve is tricuspid. Aortic valve regurgitation is trivial. No aortic stenosis is present.  5. The inferior vena cava is normal in size with greater than 50% respiratory variability, suggesting right atrial pressure of 3 mmHg. FINDINGS  Left Ventricle: Left ventricular ejection fraction, by estimation, is 60 to 65%. Left ventricular ejection fraction by PLAX is 65 %. The left ventricle has normal function. The left ventricle has no regional wall motion abnormalities. The average left ventricular global longitudinal strain is -17.2 %. The global   longitudinal strain is abnormal. The left ventricular internal cavity size was normal in size. There is mild concentric left ventricular hypertrophy of the basal-septal segment. Left  ventricular diastolic parameters are consistent with Grade I diastolic dysfunction (impaired relaxation). Indeterminate filling pressures. Right Ventricle: The right ventricular size is normal. No increase in right ventricular wall thickness. Right ventricular systolic function is normal. Tricuspid regurgitation signal is inadequate for assessing PA pressure. Left Atrium: Left atrial size was normal in size. Right Atrium: Right atrial size was normal in size. Pericardium: There is no evidence of pericardial effusion. Mitral Valve: The mitral valve is myxomatous. No evidence of mitral valve regurgitation. No evidence of mitral valve stenosis. Tricuspid Valve: The tricuspid valve is grossly normal. Tricuspid valve regurgitation is not demonstrated. No evidence of tricuspid stenosis. Aortic Valve: The aortic valve is tricuspid. Aortic valve regurgitation is trivial. No aortic stenosis is present. Pulmonic Valve: The pulmonic valve was grossly normal. Pulmonic valve regurgitation is mild. No evidence of pulmonic stenosis. Aorta: The aortic root and ascending aorta are structurally normal, with no evidence of dilitation. Venous: The inferior vena cava is normal in size with greater than 50% respiratory variability, suggesting right atrial pressure of 3 mmHg. IAS/Shunts: No atrial level shunt detected by color flow Doppler.  LEFT VENTRICLE PLAX 2D LV EF:         Left            Diastology                ventricular     LV e' medial:    5.08 cm/s                ejection        LV E/e' medial:  12.9                fraction by     LV e' lateral:   7.04 cm/s                PLAX is 65      LV E/e' lateral: 9.3                %. LVIDd:         4.00 cm         2D LVIDs:         2.60 cm         Longitudinal LV PW:         1.00 cm         Strain LV IVS:        1.20 cm         2D Strain GLS  -19.7 % LVOT diam:     2.10 cm         (A2C): LV SV:         71              2D Strain GLS  -14.4 % LV SV Index:   37              (A3C): LVOT  Area:     3.46 cm        2D Strain GLS  -17.6 %                                (A4C):  2D Strain GLS  -17.2 %                                Avg: RIGHT VENTRICLE RV S prime:     13.10 cm/s TAPSE (M-mode): 1.9 cm LEFT ATRIUM             Index        RIGHT ATRIUM          Index LA diam:        2.00 cm 1.06 cm/m   RA Area:     8.91 cm LA Vol (A2C):   54.5 ml 28.87 ml/m  RA Volume:   13.30 ml 7.05 ml/m LA Vol (A4C):   47.8 ml 25.32 ml/m LA Biplane Vol: 51.6 ml 27.34 ml/m  AORTIC VALVE             PULMONIC VALVE LVOT Vmax:   115.00 cm/s PR End Diast Vel: 1.91 msec LVOT Vmean:  80.500 cm/s LVOT VTI:    0.204 m  AORTA Ao Root diam: 3.30 cm Ao STJ diam:  2.8 cm Ao Asc diam:  3.10 cm MITRAL VALVE MV Area (PHT): 2.76 cm    SHUNTS MV Decel Time: 275 msec    Systemic VTI:  0.20 m MV E velocity: 65.60 cm/s  Systemic Diam: 2.10 cm MV A velocity: 64.30 cm/s MV E/A ratio:  1.02 Mihai Croitoru MD Electronically signed by Sanda Klein MD Signature Date/Time: 10/19/2022/9:25:11 AM    Final    CARDIAC CATHETERIZATION  Result Date: 10/18/2022   The left ventricular systolic function is normal.   LV end diastolic pressure is normal. Normal epicardial coronary arteries with right dominant coronary anatomy. Normal LV systolic function with estimated EF approximately 55%.  LVEDP 13 mmHg. RECOMMENDATION: Patient will be admitted to laboratory.  Obtain serial troponin.  He has been on lisinopril blood pressure control.  Will need to monitor LFTs which are elevated most likely due to recent excessive EtOH use.  Will obtain 2D echo Doppler study in a.m.  Lipid studies are elevated but at present defer statin therapy with elevated LFTs.   DG Chest Port 1 View  Result Date: 10/18/2022 CLINICAL DATA:  Epigastric discomfort and abnormal EKG. EXAM: PORTABLE CHEST 1 VIEW COMPARISON:  04/18/2007 FINDINGS: The heart size appears normal. No pleural effusion or interstitial edema. No airspace opacities  identified. Unchanged thoracolumbar scoliosis. IMPRESSION: No active disease. Electronically Signed   By: Kerby Moors M.D.   On: 10/18/2022 15:13   Disposition   Pt is being discharged home today in good condition.  Follow-up Plans & Appointments     Follow-up Information     Pllc, [facility] Follow up.   Specialty: Family Medicine Contact information: [place] [address] [phone]                Discharge Instructions     Amb Referral to Cardiac Rehabilitation   Complete by: As directed    Diagnosis: STEMI   After initial evaluation and assessments completed: Virtual Based Care may be provided alone or in conjunction with Phase 2 Cardiac Rehab based on patient barriers.: Yes   Intensive Cardiac Rehabilitation (ICR) [place] location only OR Traditional Cardiac Rehabilitation (TCR) *If criteria for ICR are not met will enroll in TCR [facility] only): Yes   Diet - low sodium heart healthy   Complete by: As directed    Discharge  instructions   Complete by: As directed    Radial Site Care Refer to this sheet in the next few weeks. These instructions provide you with information on caring for yourself after your procedure. Your caregiver may also give you more specific instructions. Your treatment has been planned according to current medical practices, but problems sometimes occur. Call your caregiver if you have any problems or questions after your procedure. HOME CARE INSTRUCTIONS You may shower the day after the procedure. Remove the bandage (dressing) and gently wash the site with plain soap and water. Gently pat the site dry.  Do not apply powder or lotion to the site.  Do not submerge the affected site in water for 3 to 5 days.  Inspect the site at least twice daily.  Do not flex or bend the affected arm for 24 hours.  No lifting over 5 pounds (2.3 kg) for 5 days after your procedure.  Do not drive home if you are discharged the  same day of the procedure. Have someone else drive you.  You may drive 24 hours after the procedure unless otherwise instructed by your caregiver.  What to expect: Any bruising will usually fade within 1 to 2 weeks.  Blood that collects in the tissue (hematoma) may be painful to the touch. It should usually decrease in size and tenderness within 1 to 2 weeks.  SEEK IMMEDIATE MEDICAL CARE IF: You have unusual pain at the radial site.  You have redness, warmth, swelling, or pain at the radial site.  You have drainage (other than a small amount of blood on the dressing).  You have chills.  You have a fever or persistent symptoms for more than 72 hours.  You have a fever and your symptoms suddenly get worse.  Your arm becomes pale, cool, tingly, or numb.  You have heavy bleeding from the site. Hold pressure on the site.   Increase activity slowly   Complete by: As directed         Discharge Medications   Allergies as of 10/19/2022       Reactions   Penicillins Anaphylaxis, Swelling   Throat swelling        Medication List     STOP taking these medications    doxycycline 100 MG tablet Commonly known as: VIBRA-TABS   lisinopril 20 MG tablet Commonly known as: ZESTRIL       TAKE these medications    albuterol 108 (90 Base) MCG/ACT inhaler Commonly known as: VENTOLIN HFA Inhale 2 puffs into the lungs every 6 (six) hours as needed for wheezing or shortness of breath.   atorvastatin 40 MG tablet Commonly known as: LIPITOR Take 1 tablet (40 mg total) by mouth daily. Start taking on: October 26, 2022   metoprolol tartrate 25 MG tablet Commonly known as: LOPRESSOR Take 1 tablet (25 mg total) by mouth 2 (two) times daily.   omeprazole 40 MG capsule Commonly known as: PRILOSEC Take 40 mg by mouth daily.   zolpidem 10 MG tablet Commonly known as: AMBIEN Take 10 mg by mouth at bedtime as needed for sleep.          Outstanding Labs/Studies   FLP/LFTs in 8 weeks    Duration of Discharge Encounter   Greater than 30 minutes including physician time.  Signed, Reino Bellis, NP 10/19/2022, 12:50 PM

## 2022-10-19 NOTE — Progress Notes (Addendum)
Rounding Note    Patient Name: Jerry Castillo Date of Encounter: 10/19/2022  [place] Cardiologist: None   Subjective   No complaints this morning. Echo at the bedside.   Inpatient Medications    Scheduled Meds:  aspirin  324 mg Oral Once   aspirin EC  81 mg Oral Daily   folic acid  1 mg Oral Daily   metoprolol tartrate  12.5 mg Oral BID   multivitamin with minerals  1 tablet Oral Daily   potassium chloride  20 mEq Oral Once   sodium chloride flush  3 mL Intravenous Q12H   thiamine  100 mg Oral Daily   Or   thiamine  100 mg Intravenous Daily   Continuous Infusions:  sodium chloride 20 mL/hr at 10/18/22 1525   sodium chloride     sodium chloride     PRN Meds: sodium chloride, acetaminophen, LORazepam **OR** LORazepam, nitroGLYCERIN, ondansetron (ZOFRAN) IV, sodium chloride flush   Vital Signs    Vitals:   10/19/22 0011 10/19/22 0100 10/19/22 0411 10/19/22 0834  BP: (!) 141/85  (!) 144/85 (!) 158/87  Pulse: 74 74 79 74  Resp: 15  16 15   Temp: 98.8 F (37.1 C)  98.2 F (36.8 C) 98.4 F (36.9 C)  TempSrc: Oral  Oral Oral  SpO2:   98% 99%  Weight:   81.2 kg   Height:        Intake/Output Summary (Last 24 hours) at 10/19/2022 0918 Last data filed at 10/19/2022 0031 Gross per 24 hour  Intake 660.18 ml  Output --  Net 660.18 ml      10/19/2022    4:11 AM 10/18/2022    5:35 PM 10/18/2022    2:20 PM  Last 3 Weights  Weight (lbs) 179 lb 1.6 oz 181 lb 3.5 oz 185 lb  Weight (kg) 81.239 kg 82.2 kg 83.915 kg      Telemetry    Sinus Rhythm with short runs of SVT- Personally Reviewed  ECG    Sinus Rhythm with RBBB, 75bpm - Personally Reviewed  Physical Exam   GEN: No acute distress.   Neck: No JVD Cardiac: RRR, no murmurs, rubs, or gallops.  Respiratory: Clear to auscultation bilaterally. GI: Soft, nontender, non-distended  MS: No edema; No deformity. Right radial cath site stable.  Neuro:  Nonfocal  Psych: Normal affect   Labs    High  Sensitivity Troponin:   Recent Labs  Lab 10/18/22 1110 10/18/22 1453 10/18/22 1801  TROPONINIHS 74* 56* 68*     Chemistry Recent Labs  Lab 10/17/22 1337 10/18/22 1453 10/18/22 1801 10/19/22 0118  NA 136 133*  --  134*  K 3.8 3.4*  --  3.8  CL 89* 92*  --  95*  CO2 23 31  --  29  GLUCOSE 138* 108*  --  122*  BUN 23* 16  --  15  CREATININE 1.17 1.27*  --  1.10  CALCIUM 8.7* 9.1  --  8.8*  MG 2.5*  --  2.2  --   PROT 7.6 6.0*  --   --   ALBUMIN 4.4 3.6  --   --   AST 126* 95*  --   --   ALT 101* 79*  --   --   ALKPHOS 67 61  --   --   BILITOT 0.9 1.1  --   --   GFRNONAA >60 >60  --  >60  ANIONGAP 24* 10  --  10    Lipids  Recent Labs  Lab 10/19/22 0119  CHOL 216*  TRIG 130  HDL 75  LDLCALC 115*  CHOLHDL 2.9    Hematology Recent Labs  Lab 10/17/22 1337 10/18/22 1453 10/19/22 0118  WBC 8.7 7.2 5.7  RBC 5.14 4.24 3.95*  HGB 16.9 13.9 12.7*  HCT 45.8 38.7* 37.2*  MCV 89.1 91.3 94.2  MCH 32.9 32.8 32.2  MCHC 36.9* 35.9 34.1  RDW 11.7 11.7 11.7  PLT 271 180 174   Thyroid  Recent Labs  Lab 10/18/22 1759 10/18/22 1801  TSH 1.241  --   FREET4  --  1.45*    BNP Recent Labs  Lab 10/18/22 1110  BNP 64.7    DDimer No results for input(s): "DDIMER" in the last 168 hours.   Radiology    CARDIAC CATHETERIZATION  Result Date: 10/18/2022   The left ventricular systolic function is normal.   LV end diastolic pressure is normal. Normal epicardial coronary arteries with right dominant coronary anatomy. Normal LV systolic function with estimated EF approximately 55%.  LVEDP 13 mmHg. RECOMMENDATION: Patient will be admitted to laboratory.  Obtain serial troponin.  He has been on lisinopril blood pressure control.  Will need to monitor LFTs which are elevated most likely due to recent excessive EtOH use.  Will obtain 2D echo Doppler study in a.m.  Lipid studies are elevated but at present defer statin therapy with elevated LFTs.   DG Chest Port 1 View  Result  Date: 10/18/2022 CLINICAL DATA:  Epigastric discomfort and abnormal EKG. EXAM: PORTABLE CHEST 1 VIEW COMPARISON:  04/18/2007 FINDINGS: The heart size appears normal. No pleural effusion or interstitial edema. No airspace opacities identified. Unchanged thoracolumbar scoliosis. IMPRESSION: No active disease. Electronically Signed   By: Kerby Moors M.D.   On: 10/18/2022 15:13    Cardiac Studies   Cath: 10/18/2022     The left ventricular systolic function is normal.   LV end diastolic pressure is normal.   Normal epicardial coronary arteries with right dominant coronary anatomy.   Normal LV systolic function with estimated EF approximately 55%.  LVEDP 13 mmHg.   RECOMMENDATION: Patient will be admitted to laboratory.  Obtain serial troponin.  He has been on lisinopril blood pressure control.  Will need to monitor LFTs which are elevated most likely due to recent excessive EtOH use.  Will obtain 2D echo Doppler study in a.m.  Lipid studies are elevated but at present defer statin therapy with elevated LFTs.   Patient Profile     57 y.o. male with HTN, GERD, hx murmur, hernia repair 2021, and ETOH use who was seen 10/18/2022 for the evaluation of chest pain with abnormal EKG.   Assessment & Plan    Abnormal EKG -- presented to [facility] with plans for ETOH detox. Was noted to have abnormal EKG and concern for STEMI. He was taken to the cath lab with cardiac cath noted above normal coronaries. No chest pain.  -- echo pending official read  ETOH Abuse Detox -- presented to [facility] 12/31, has been on CIWA here. Will touch base with IM about further recommendations for dispo  Elevated LFTs -- AST 126>>>95, ALT 101>>79 -- will repeat LFTs this morning -- deferred initiation of statin on admission in the setting of elevated LFTs  Hypertension -- blood pressures elevated at times -- increase metoprolol to 25mg  BID   For questions or updates, please contact [place] Please consult  [email or web address] for contact info under  Signed, Laverda Page, NP  10/19/2022, 9:18 AM     Patient seen and examined. Agree with assessment and plan.  Patient feels well.  No chest pain or dyspnea.  He was seen by MD from behavioral health yesterday.  He has been given clearance for discharge and no need to return to behavioral health.  No need for Ativan at discharge.  LFTs today improved to AST 75, ALT 68.   Will plan to initiate low-dose atorvastatin 20 mg to initiate next week versus zetia for LDL elevation.  Patient is followed at [facility] in [place] and had seen Dr. Sherwood Gambler in the past.  OK for discharge today   Lennette Bihari, MD, [facility] 10/19/2022 11:24 AM

## 2022-10-19 NOTE — Progress Notes (Signed)
CARDIAC REHAB PHASE I   PRE:  Rate/Rhythm: 77 SR  BP:  Sitting: 158/87      SaO2: 96 RA  MODE:  Ambulation: 470 ft   POST:  Rate/Rhythm: 82 SR  BP:  Sitting: 151/88      SaO2: 97 RA   Pt ambulated in hall independently with steady pace. Tolerated well with no SOB or CP. Returned to bed with call bell and bedside table in reach. MI education including MI booklet, heart healthy diet, tobacco cessation, exercise guidelines, risk factors, restrictions, site care and CRP2 reviewed. All questions and concerns addressed. Will refer to AP for CRP2.  Will continue to follow.   [id number]  Vanessa Barbara, RN BSN 10/19/2022 11:16 AM

## 2022-10-19 NOTE — Plan of Care (Signed)
  Problem: Activity: Goal: Ability to tolerate increased activity will improve Outcome: Progressing   Problem: Clinical Measurements: Goal: Will remain free from infection Outcome: Progressing Goal: Diagnostic test results will improve Outcome: Progressing

## 2022-10-19 NOTE — Progress Notes (Signed)
  Echocardiogram 2D Echocardiogram has been performed.  Darlina Sicilian M 10/19/2022, 9:14 AM

## 2022-10-19 NOTE — Progress Notes (Signed)
HR elevated to 140's. Patient stated that he had gotten up to go to the bathroom. Went back to bed, straightened his covers and laid back down. This was the first time that patient's HR elevated to 140s when getting up to go to the bathroom. HR returned 70-80s.   Patient denies CP or SOB.   Will continue to monitor

## 2022-10-19 NOTE — Progress Notes (Addendum)
Patient given discharge instructions. PIV removed. Telemetry box removed, CCMD notified. Patient taken to vehicle in wheelchair by staff.  Chidera Dearcos L Anzal Bartnick, RN  

## 2022-10-20 LAB — LIPOPROTEIN A (LPA): Lipoprotein (a): 116.9 nmol/L — ABNORMAL HIGH (ref <75.0)

## 2022-10-29 ENCOUNTER — Ambulatory Visit: Payer: BC Managed Care – PPO | Attending: Nurse Practitioner | Admitting: Nurse Practitioner

## 2022-10-29 ENCOUNTER — Encounter: Payer: Self-pay | Admitting: Nurse Practitioner

## 2022-10-29 VITALS — BP 136/80 | HR 86 | Ht 65.0 in | Wt 195.4 lb

## 2022-10-29 DIAGNOSIS — F419 Anxiety disorder, unspecified: Secondary | ICD-10-CM | POA: Diagnosis not present

## 2022-10-29 DIAGNOSIS — R9431 Abnormal electrocardiogram [ECG] [EKG]: Secondary | ICD-10-CM

## 2022-10-29 DIAGNOSIS — F1010 Alcohol abuse, uncomplicated: Secondary | ICD-10-CM

## 2022-10-29 DIAGNOSIS — E7800 Pure hypercholesterolemia, unspecified: Secondary | ICD-10-CM

## 2022-10-29 DIAGNOSIS — E785 Hyperlipidemia, unspecified: Secondary | ICD-10-CM

## 2022-10-29 DIAGNOSIS — I10 Essential (primary) hypertension: Secondary | ICD-10-CM | POA: Diagnosis not present

## 2022-10-29 NOTE — Patient Instructions (Addendum)
Medication Instructions:  Your physician recommends that you continue on your current medications as directed. Please refer to the Current Medication list given to you today.   Labwork: None  Testing/Procedures: None  Follow-Up: Follow up with Dr. Dellia Cloud in 3-4 months.   Any Other Special Instructions Will Be Listed Below (If Applicable).  You have been referred to [facility]. They will give you a call with your first appointment information.    Your physician has requested that you regularly monitor and record your blood pressure readings at home. Please use the same machine at the same time of day to check your readings and record them to bring to your follow-up visit.   If you need a refill on your cardiac medications before your next appointment, please call your pharmacy.   Mediterranean Diet A Mediterranean diet refers to food and lifestyle choices that are based on the traditions of countries located on the [employer]. It focuses on eating more fruits, vegetables, whole grains, beans, nuts, seeds, and heart-healthy fats, and eating less dairy, meat, eggs, and processed foods with added sugar, salt, and fat. This way of eating has been shown to help prevent certain conditions and improve outcomes for people who have chronic diseases, like kidney disease and heart disease. What are tips for following this plan? Reading food labels Check the serving size of packaged foods. For foods such as rice and pasta, the serving size refers to the amount of cooked product, not dry. Check the total fat in packaged foods. Avoid foods that have saturated fat or trans fats. Check the ingredient list for added sugars, such as corn syrup. Shopping  Buy a variety of foods that offer a balanced diet, including: Fresh fruits and vegetables (produce). Grains, beans, nuts, and seeds. Some of these may be available in unpackaged forms or large amounts (in bulk). Fresh  seafood. Poultry and eggs. Low-fat dairy products. Buy whole ingredients instead of prepackaged foods. Buy fresh fruits and vegetables in-season from local farmers markets. Buy plain frozen fruits and vegetables. If you do not have access to quality fresh seafood, buy precooked frozen shrimp or canned fish, such as tuna, salmon, or sardines. Stock your pantry so you always have certain foods on hand, such as olive oil, canned tuna, canned tomatoes, rice, pasta, and beans. Cooking Cook foods with extra-virgin olive oil instead of using butter or other vegetable oils. Have meat as a side dish, and have vegetables or grains as your main dish. This means having meat in small portions or adding small amounts of meat to foods like pasta or stew. Use beans or vegetables instead of meat in common dishes like chili or lasagna. Experiment with different cooking methods. Try roasting, broiling, steaming, and sauting vegetables. Add frozen vegetables to soups, stews, pasta, or rice. Add nuts or seeds for added healthy fats and plant protein at each meal. You can add these to yogurt, salads, or vegetable dishes. Marinate fish or vegetables using olive oil, lemon juice, garlic, and fresh herbs. Meal planning Plan to eat one vegetarian meal one day each week. Try to work up to two vegetarian meals, if possible. Eat seafood two or more times a week. Have healthy snacks readily available, such as: Vegetable sticks with hummus. Greek yogurt. Fruit and nut trail mix. Eat balanced meals throughout the week. This includes: Fruit: 2-3 servings a day. Vegetables: 4-5 servings a day. Low-fat dairy: 2 servings a day. Fish, poultry, or lean meat: 1 serving a day. Beans  and legumes: 2 or more servings a week. Nuts and seeds: 1-2 servings a day. Whole grains: 6-8 servings a day. Extra-virgin olive oil: 3-4 servings a day. Limit red meat and sweets to only a few servings a month. Lifestyle  Cook and eat meals  together with your family, when possible. Drink enough fluid to keep your urine pale yellow. Be physically active every day. This includes: Aerobic exercise like running or swimming. Leisure activities like gardening, walking, or housework. Get 7-8 hours of sleep each night. If recommended by your health care provider, drink red wine in moderation. This means 1 glass a day for nonpregnant women and 2 glasses a day for men. A glass of wine equals 5 oz (150 mL). What foods should I eat? Fruits Apples. Apricots. Avocado. Berries. Bananas. Cherries. Dates. Figs. Grapes. Lemons. Melon. Oranges. Peaches. Plums. Pomegranate. Vegetables Artichokes. Beets. Broccoli. Cabbage. Carrots. Eggplant. Green beans. Chard. Kale. Spinach. Onions. Leeks. Peas. Squash. Tomatoes. Peppers. Radishes. Grains Whole-grain pasta. Brown rice. Bulgur wheat. Polenta. Couscous. Whole-wheat bread. [place]. Meats and other proteins Beans. Almonds. Sunflower seeds. Pine nuts. Peanuts. [place]. Salmon. Scallops. Shrimp. [place]. Tilapia. Clams. Oysters. Eggs. Poultry without skin. Dairy Low-fat milk. Cheese. Greek yogurt. Fats and oils Extra-virgin olive oil. Avocado oil. Grapeseed oil. Beverages Water. Red wine. Herbal tea. Sweets and desserts Greek yogurt with honey. Baked apples. Poached pears. Trail mix. Seasonings and condiments Basil. Cilantro. Coriander. Cumin. Mint. Parsley. Sage. Rosemary. Tarragon. Garlic. Oregano. Thyme. Pepper. Balsamic vinegar. Tahini. Hummus. Tomato sauce. Olives. Mushrooms. The items listed above may not be a complete list of foods and beverages you can eat. Contact a dietitian for more information. What foods should I limit? This is a list of foods that should be eaten rarely or only on special occasions. Fruits Fruit canned in syrup. Vegetables Deep-fried potatoes (french fries). Grains Prepackaged pasta or rice dishes. Prepackaged cereal with added sugar. Prepackaged snacks with  added sugar. Meats and other proteins Beef. Pork. Lamb. Poultry with skin. Hot dogs. [place] Salines. Dairy Ice cream. Sour cream. Whole milk. Fats and oils Butter. Canola oil. Vegetable oil. Beef fat (tallow). Lard. Beverages Juice. Sugar-sweetened soft drinks. Beer. Liquor and spirits. Sweets and desserts Cookies. Cakes. Pies. Candy. Seasonings and condiments Mayonnaise. Pre-made sauces and marinades. The items listed above may not be a complete list of foods and beverages you should limit. Contact a dietitian for more information. Summary The Mediterranean diet includes both food and lifestyle choices. Eat a variety of fresh fruits and vegetables, beans, nuts, seeds, and whole grains. Limit the amount of red meat and sweets that you eat. If recommended by your health care provider, drink red wine in moderation. This means 1 glass a day for nonpregnant women and 2 glasses a day for men. A glass of wine equals 5 oz (150 mL). This information is not intended to replace advice given to you by your health care provider. Make sure you discuss any questions you have with your health care provider. Document Revised: 11/09/2019 Document Reviewed: 09/06/2019 [employer] Patient Education  [place].

## 2022-10-29 NOTE — Progress Notes (Unsigned)
Cardiology Office Note:    Date:  10/29/2022  ID:  Jerry Castillo, DOB 1965-12-28, MRN [id number]  PCP:  [facility] Cardiologist:  Chalmers Guest, MD     Referring MD: [facility]   CC: Here for hospital follow-up  History of Present Illness:    Jerry Castillo is a very pleasant 57 y.o. male with a hx of the following:  Abnormal EKG History of EtOH abuse/detox Elevated LFTs Hypertension Hyperlipidemia, hypercholesterolemia  Presented to behavioral health with plans for alcohol detox.  Was noted to have abnormal EKG and concern for STEMI.  It appears that code STEMI was activated and patient was taken to the Cath Lab that revealed normal coronary arteries.  Echocardiogram revealed EF 60 to 65%, no RWMA, mild LVH, grade 1 DD, no significant valvular abnormalities.  Hospital course was complicated by elevated liver enzymes that did trend down.  Was instructed to start atorvastatin 40 mg daily on October 26, 2022.  Was discharged in stable condition on October 19, 2022.  Was instructed to follow-up with cardiology outpatient.  Today he presents for follow-up.  He states he is doing well, however does have anxiety and stress with his job. Works as a Solicitor. Requesting a note to go back to work.  Denies any chest pain, shortness of breath, palpitations, syncope, presyncope, dizziness, orthopnea, PND, swelling or significant weight changes, acute bleeding, or claudication.  Denies any other questions or concerns today.  Past Medical History:  Diagnosis Date   GERD (gastroesophageal reflux disease)    Heart murmur    as a child   History of seasonal allergies    Hypertension    Pleurisy    Pneumonia    Scoliosis    Sleep apnea    uses a mouth guard, unable to tolerate CPAP    Past Surgical History:  Procedure Laterality Date   BACK SURGERY     x3 for scoliosis   HERNIA REPAIR     x2   INSERTION OF  MESH N/A 10/02/2020   Procedure: INSERTION OF MESH;  Surgeon: Ralene Ok, MD;  Location: [place];  Service: General;  Laterality: N/A;   LEFT HEART CATH AND CORONARY ANGIOGRAPHY N/A 10/18/2022   Procedure: LEFT HEART CATH AND CORONARY ANGIOGRAPHY;  Surgeon: Troy Sine, MD;  Location: [place] [facility];  Service: Cardiovascular;  Laterality: N/A;   LEG SURGERY Right    repair - plates and screws   MANDIBLE FRACTURE SURGERY     NASAL SINUS SURGERY     VENTRAL HERNIA REPAIR  10/02/2020   VENTRAL HERNIA REPAIR N/A 10/02/2020   Procedure: LAPAROSCOPIC VENTRAL HERNIA REPAIR WITH MESH;  Surgeon: Ralene Ok, MD;  Location: [place];  Service: General;  Laterality: N/A;    Current Medications: Current Meds  Medication Sig   albuterol (VENTOLIN HFA) 108 (90 Base) MCG/ACT inhaler Inhale 2 puffs into the lungs every 6 (six) hours as needed for wheezing or shortness of breath.   atorvastatin (LIPITOR) 40 MG tablet Take 1 tablet (40 mg total) by mouth daily.   fluticasone (FLONASE) 50 MCG/ACT nasal spray Place 2 sprays into both nostrils daily as needed for allergies or rhinitis.   guaiFENesin (MUCINEX) 600 MG 12 hr tablet Take 600 mg by mouth 2 (two) times daily as needed for cough or to loosen phlegm.   metoprolol tartrate (LOPRESSOR) 25 MG tablet Take 1 tablet (25 mg total) by  mouth 2 (two) times daily.   omeprazole (PRILOSEC) 40 MG capsule Take 40 mg by mouth daily.   sodium chloride (OCEAN) 0.65 % SOLN nasal spray Place 1 spray into both nostrils daily as needed for congestion.   sulfamethoxazole-trimethoprim (BACTRIM DS) 800-160 MG tablet Take 1 tablet by mouth 2 (two) times daily.   temazepam (RESTORIL) 15 MG capsule Take 15 mg by mouth daily.     Allergies:   Penicillins   Social History   Socioeconomic History   Marital status: Divorced    Spouse name: Not on file   Number of children: Not on file   Years of education: Not on file   Highest education level: Not on file   Occupational History   Not on file  Tobacco Use   Smoking status: Never   Smokeless tobacco: Current    Types: Snuff  Vaping Use   Vaping Use: Never used  Substance and Sexual Activity   Alcohol use: Not Currently   Drug use: Not Currently   Sexual activity: Not on file  Other Topics Concern   Not on file  Social History Narrative   Not on file   Social Determinants of Health   Financial Resource Strain: Not on file  Food Insecurity: Not on file  Transportation Needs: Not on file  Physical Activity: Not on file  Stress: Not on file  Social Connections: Not on file     ROS:   Review of Systems  Constitutional: Negative.   HENT: Negative.    Eyes: Negative.   Respiratory: Negative.    Cardiovascular: Negative.   Gastrointestinal: Negative.   Genitourinary: Negative.   Musculoskeletal: Negative.   Skin: Negative.   Neurological: Negative.   Endo/Heme/Allergies: Negative.   Psychiatric/Behavioral:  Negative for depression, hallucinations, memory loss, substance abuse and suicidal ideas. The patient is nervous/anxious. The patient does not have insomnia.     Please see the history of present illness.    All other systems reviewed and are negative.  EKGs/Labs/Other Studies Reviewed:    The following studies were reviewed today:   EKG:  EKG is not ordered today.    Echo on 10/19/2022:  1. Left ventricular ejection fraction, by estimation, is 60 to 65%. Left  ventricular ejection fraction by PLAX is 65 %. The left ventricle has  normal function. The left ventricle has no regional wall motion  abnormalities. There is mild concentric left  ventricular hypertrophy of the basal-septal segment. Left ventricular  diastolic parameters are consistent with Grade I diastolic dysfunction  (impaired relaxation). The average left ventricular global longitudinal  strain is -17.2 %. The global  longitudinal strain is abnormal.   2. Right ventricular systolic function is  normal. The right ventricular  size is normal. Tricuspid regurgitation signal is inadequate for assessing  PA pressure.   3. The mitral valve is myxomatous. No evidence of mitral valve  regurgitation. No evidence of mitral stenosis.   4. The aortic valve is tricuspid. Aortic valve regurgitation is trivial.  No aortic stenosis is present.   5. The inferior vena cava is normal in size with greater than 50%  respiratory variability, suggesting right atrial pressure of 3 mmHg.  Left heart cath on 10/18/2022:   The left ventricular systolic function is normal.   LV end diastolic pressure is normal.   Normal epicardial coronary arteries with right dominant coronary anatomy.   Normal LV systolic function with estimated EF approximately 55%.  LVEDP 13 mmHg.   RECOMMENDATION: Patient  will be admitted to laboratory.  Obtain serial troponin.  He has been on lisinopril blood pressure control.  Will need to monitor LFTs which are elevated most likely due to recent excessive EtOH use.  Will obtain 2D echo Doppler study in a.m.  Lipid studies are elevated but at present defer statin therapy with elevated LFTs.   Recent Labs: 10/18/2022: B Natriuretic Peptide 64.7; Magnesium 2.2; TSH 1.241 10/19/2022: ALT 68; BUN 15; Creatinine, Ser 1.10; Hemoglobin 12.7; Platelets 174; Potassium 3.8; Sodium 134  Recent Lipid Panel    Component Value Date/Time   CHOL 216 (H) 10/19/2022 0119   TRIG 130 10/19/2022 0119   HDL 75 10/19/2022 0119   CHOLHDL 2.9 10/19/2022 0119   VLDL 26 10/19/2022 0119   LDLCALC 115 (H) 10/19/2022 0119    Physical Exam:    VS:  BP 136/80   Pulse 86   Ht 5\' 5"  (1.651 m)   Wt 195 lb 6.4 oz (88.6 kg)   SpO2 98%   BMI 32.52 kg/m     Wt Readings from Last 3 Encounters:  10/29/22 195 lb 6.4 oz (88.6 kg)  10/19/22 179 lb 1.6 oz (81.2 kg)  10/02/20 191 lb 14.4 oz (87 kg)     GEN: Well nourished 57 y.o. male in no acute distress HEENT: Normal NECK: No JVD; No carotid bruits CARDIAC:  S1/S2, RRR, no murmurs, rubs, gallops; 2+ pulses; well healed cardiac cath site along right radial site, no complications RESPIRATORY:  Clear to auscultation without rales, wheezing or rhonchi  MUSCULOSKELETAL:  No edema; No deformity  SKIN: Warm and dry NEUROLOGIC:  Alert and oriented x 3 PSYCHIATRIC:  Normal affect   ASSESSMENT:    1. Abnormal EKG   2. ETOH abuse   3. Anxiety   4. Hypertension, unspecified type   5. Hyperlipidemia, unspecified hyperlipidemia type   6. Hypercholesterolemia    PLAN:    In order of problems listed above:  Abnormal EKG When presenting to ED, he had EKG that showed SR, with RBBB and ST elevated in V5-6. Trops elevated. Code STEMI was activated. However, cardiac cath revealed normal coronary arteries. Case d/w DOD, Dr. Carlyle Dolly who agrees patient did not have a true STEMI, just EKG changes. Echo was overall unremarkable. Continue current medication regimen. Heart healthy diet and regular cardiovascular exercise encouraged.   History of ETOH abuse/detox/anxiety Hx of BH care for ETOH detox when he experienced CP. Today, he notes anxiety. Will refer to psychiatry. ED precautions discussed.   HTN BP 136/80. SBP goal < 130. Discussed to monitor BP at home at least 2 hours after medications and sitting for 5-10 minutes. Given BP log and discussed to notify office if SBP is consistently not at goal. Continue current medication regimen. Heart healthy diet and regular cardiovascular exercise encouraged.   HLD, hypercholesterolemia Lipid panel obtained recently revealed total cholesterol 216, triglycerides 130, HDL 75, and LDL 115. Started on atorvastatin during hospitalization. PCP to follow labs. Heart healthy diet and regular cardiovascular exercise encouraged.      5. Disposition: Pt was given work note today. Follow-up with Dr. Dellia Cloud in 3-4 months or sooner if anything changes.   Medication Adjustments/Labs and Tests Ordered: Current medicines  are reviewed at length with the patient today.  Concerns regarding medicines are outlined above.  Orders Placed This Encounter  Procedures   Ambulatory referral to [place]   No orders of the defined types were placed in this encounter.   Patient Instructions  Medication Instructions:  Your physician recommends that you continue on your current medications as directed. Please refer to the Current Medication list given to you today.   Labwork: None  Testing/Procedures: None  Follow-Up: Follow up with Dr. Dellia Cloud in 3-4 months.   Any Other Special Instructions Will Be Listed Below (If Applicable).  You have been referred to [facility]. They will give you a call with your first appointment information.    Your physician has requested that you regularly monitor and record your blood pressure readings at home. Please use the same machine at the same time of day to check your readings and record them to bring to your follow-up visit.   If you need a refill on your cardiac medications before your next appointment, please call your pharmacy.   Mediterranean Diet A Mediterranean diet refers to food and lifestyle choices that are based on the traditions of countries located on the [employer]. It focuses on eating more fruits, vegetables, whole grains, beans, nuts, seeds, and heart-healthy fats, and eating less dairy, meat, eggs, and processed foods with added sugar, salt, and fat. This way of eating has been shown to help prevent certain conditions and improve outcomes for people who have chronic diseases, like kidney disease and heart disease. What are tips for following this plan? Reading food labels Check the serving size of packaged foods. For foods such as rice and pasta, the serving size refers to the amount of cooked product, not dry. Check the total fat in packaged foods. Avoid foods that have saturated fat or trans fats. Check the ingredient list for  added sugars, such as corn syrup. Shopping  Buy a variety of foods that offer a balanced diet, including: Fresh fruits and vegetables (produce). Grains, beans, nuts, and seeds. Some of these may be available in unpackaged forms or large amounts (in bulk). Fresh seafood. Poultry and eggs. Low-fat dairy products. Buy whole ingredients instead of prepackaged foods. Buy fresh fruits and vegetables in-season from local farmers markets. Buy plain frozen fruits and vegetables. If you do not have access to quality fresh seafood, buy precooked frozen shrimp or canned fish, such as tuna, salmon, or sardines. Stock your pantry so you always have certain foods on hand, such as olive oil, canned tuna, canned tomatoes, rice, pasta, and beans. Cooking Cook foods with extra-virgin olive oil instead of using butter or other vegetable oils. Have meat as a side dish, and have vegetables or grains as your main dish. This means having meat in small portions or adding small amounts of meat to foods like pasta or stew. Use beans or vegetables instead of meat in common dishes like chili or lasagna. Experiment with different cooking methods. Try roasting, broiling, steaming, and sauting vegetables. Add frozen vegetables to soups, stews, pasta, or rice. Add nuts or seeds for added healthy fats and plant protein at each meal. You can add these to yogurt, salads, or vegetable dishes. Marinate fish or vegetables using olive oil, lemon juice, garlic, and fresh herbs. Meal planning Plan to eat one vegetarian meal one day each week. Try to work up to two vegetarian meals, if possible. Eat seafood two or more times a week. Have healthy snacks readily available, such as: Vegetable sticks with hummus. Greek yogurt. Fruit and nut trail mix. Eat balanced meals throughout the week. This includes: Fruit: 2-3 servings a day. Vegetables: 4-5 servings a day. Low-fat dairy: 2 servings a day. Fish, poultry, or lean meat: 1  serving a day.  Beans and legumes: 2 or more servings a week. Nuts and seeds: 1-2 servings a day. Whole grains: 6-8 servings a day. Extra-virgin olive oil: 3-4 servings a day. Limit red meat and sweets to only a few servings a month. Lifestyle  Cook and eat meals together with your family, when possible. Drink enough fluid to keep your urine pale yellow. Be physically active every day. This includes: Aerobic exercise like running or swimming. Leisure activities like gardening, walking, or housework. Get 7-8 hours of sleep each night. If recommended by your health care provider, drink red wine in moderation. This means 1 glass a day for nonpregnant women and 2 glasses a day for men. A glass of wine equals 5 oz (150 mL). What foods should I eat? Fruits Apples. Apricots. Avocado. Berries. Bananas. Cherries. Dates. Figs. Grapes. Lemons. Melon. Oranges. Peaches. Plums. Pomegranate. Vegetables Artichokes. Beets. Broccoli. Cabbage. Carrots. Eggplant. Green beans. Chard. Kale. Spinach. Onions. Leeks. Peas. Squash. Tomatoes. Peppers. Radishes. Grains Whole-grain pasta. Brown rice. Bulgur wheat. Polenta. Couscous. Whole-wheat bread. Orpah Cobb. Meats and other proteins Beans. Almonds. Sunflower seeds. Pine nuts. Peanuts. Cod. Salmon. Scallops. Shrimp. Tuna. Tilapia. Clams. Oysters. Eggs. Poultry without skin. Dairy Low-fat milk. Cheese. Greek yogurt. Fats and oils Extra-virgin olive oil. Avocado oil. Grapeseed oil. Beverages Water. Red wine. Herbal tea. Sweets and desserts Greek yogurt with honey. Baked apples. Poached pears. Trail mix. Seasonings and condiments Basil. Cilantro. Coriander. Cumin. Mint. Parsley. Sage. Rosemary. Tarragon. Garlic. Oregano. Thyme. Pepper. Balsamic vinegar. Tahini. Hummus. Tomato sauce. Olives. Mushrooms. The items listed above may not be a complete list of foods and beverages you can eat. Contact a dietitian for more information. What foods should I  limit? This is a list of foods that should be eaten rarely or only on special occasions. Fruits Fruit canned in syrup. Vegetables Deep-fried potatoes (french fries). Grains Prepackaged pasta or rice dishes. Prepackaged cereal with added sugar. Prepackaged snacks with added sugar. Meats and other proteins Beef. Pork. Lamb. Poultry with skin. Hot dogs. Tomasa Blase. Dairy Ice cream. Sour cream. Whole milk. Fats and oils Butter. Canola oil. Vegetable oil. Beef fat (tallow). Lard. Beverages Juice. Sugar-sweetened soft drinks. Beer. Liquor and spirits. Sweets and desserts Cookies. Cakes. Pies. Candy. Seasonings and condiments Mayonnaise. Pre-made sauces and marinades. The items listed above may not be a complete list of foods and beverages you should limit. Contact a dietitian for more information. Summary The Mediterranean diet includes both food and lifestyle choices. Eat a variety of fresh fruits and vegetables, beans, nuts, seeds, and whole grains. Limit the amount of red meat and sweets that you eat. If recommended by your health care provider, drink red wine in moderation. This means 1 glass a day for nonpregnant women and 2 glasses a day for men. A glass of wine equals 5 oz (150 mL). This information is not intended to replace advice given to you by your health care provider. Make sure you discuss any questions you have with your health care provider. Document Revised: 11/09/2019 Document Reviewed: 09/06/2019 Elsevier Patient Education  [address]    Signed, Sharlene Dory, NP  10/31/2022 10:20 PM    [facility]

## 2022-11-15 ENCOUNTER — Other Ambulatory Visit (HOSPITAL_COMMUNITY): Payer: Self-pay | Admitting: Family Medicine

## 2022-11-15 ENCOUNTER — Encounter (HOSPITAL_COMMUNITY): Payer: Self-pay | Admitting: Family Medicine

## 2022-11-15 ENCOUNTER — Ambulatory Visit
Admission: RE | Admit: 2022-11-15 | Discharge: 2022-11-15 | Disposition: A | Discharge status: Home / Self Care | Payer: BC Managed Care – PPO | Source: Ambulatory Visit | Attending: Family Medicine | Admitting: Family Medicine

## 2022-11-15 ENCOUNTER — Ambulatory Visit (INDEPENDENT_AMBULATORY_CARE_PROVIDER_SITE_OTHER): Payer: BC Managed Care – PPO | Admitting: Urology

## 2022-11-15 ENCOUNTER — Ambulatory Visit: Payer: BC Managed Care – PPO | Admitting: Urology

## 2022-11-15 VITALS — BP 162/99 | HR 74 | Ht 65.0 in | Wt 190.0 lb

## 2022-11-15 DIAGNOSIS — N281 Cyst of kidney, acquired: Secondary | ICD-10-CM | POA: Diagnosis not present

## 2022-11-15 DIAGNOSIS — N138 Other obstructive and reflux uropathy: Secondary | ICD-10-CM

## 2022-11-15 DIAGNOSIS — A46 Erysipelas: Secondary | ICD-10-CM

## 2022-11-15 DIAGNOSIS — N401 Enlarged prostate with lower urinary tract symptoms: Secondary | ICD-10-CM

## 2022-11-15 DIAGNOSIS — N200 Calculus of kidney: Secondary | ICD-10-CM | POA: Diagnosis not present

## 2022-11-15 DIAGNOSIS — R350 Frequency of micturition: Secondary | ICD-10-CM

## 2022-11-15 DIAGNOSIS — M7989 Other specified soft tissue disorders: Secondary | ICD-10-CM

## 2022-11-15 LAB — URINALYSIS, ROUTINE W REFLEX MICROSCOPIC
Bilirubin, UA: NEGATIVE
Glucose, UA: NEGATIVE
Ketones, UA: NEGATIVE
Leukocytes,UA: NEGATIVE
Nitrite, UA: NEGATIVE
Protein,UA: NEGATIVE
RBC, UA: NEGATIVE
Specific Gravity, UA: 1.015 (ref 1.005–1.030)
Urobilinogen, Ur: 0.2 mg/dL (ref 0.2–1.0)
pH, UA: 7 (ref 5.0–7.5)

## 2022-11-15 MED ORDER — TAMSULOSIN HCL 0.4 MG PO CAPS: 0.4000 mg | ORAL_CAPSULE | Freq: Every day | ORAL | 3 refills | Status: DC

## 2022-11-15 NOTE — Progress Notes (Unsigned)
11/15/2022 3:26 PM   Jerry Castillo 1966-10-14 [id number]  Referring provider: [facility] [address] [place],   [address]  No chief complaint on file.   HPI:  New patient-  1) frequency, urgency-noted in April 2023 and even before.  He was treated with doxycycline and Bactrim.  A 2021 CT scan showed an unremarkable prostate.  His March 2023 PSA was 0.6.  2) renal cyst-patient had a complex left lower pole cyst of 6.8 cm on CT in 2021.  3) kidney stone-patient had a 15 mm left lower pole stone on CT in 2021.  Today, seen for the above. He had MI last month. No stent. No blood thinner. Maybe artery spasm. AUASS = 21. Weak st, freq and urgency. Noc x 2. He has OSA and uses a device or machine. No GU meds or surgery. No NG risk. Cr 1.27 in Jan 2024. No gross hematuria.   UA clear.   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   PMH: Past Medical History:  Diagnosis Date   GERD (gastroesophageal reflux disease)    Heart murmur    as a child   History of seasonal allergies    Hypertension    Pleurisy    Pneumonia    Scoliosis    Sleep apnea    uses a mouth guard, unable to tolerate CPAP    Surgical History: Past Surgical History:  Procedure Laterality Date   BACK SURGERY     x3 for scoliosis   HERNIA REPAIR     x2   INSERTION OF MESH N/A 10/02/2020   Procedure: INSERTION OF MESH;  Surgeon: Ralene Ok, MD;  Location: [place];  Service: General;  Laterality: N/A;   LEFT HEART CATH AND CORONARY ANGIOGRAPHY N/A 10/18/2022   Procedure: LEFT HEART CATH AND CORONARY ANGIOGRAPHY;  Surgeon: Troy Sine, MD;  Location: [place] [facility];  Service: Cardiovascular;  Laterality: N/A;   LEG SURGERY Right    repair - plates and screws   MANDIBLE FRACTURE SURGERY     NASAL SINUS SURGERY     VENTRAL HERNIA REPAIR  10/02/2020   VENTRAL HERNIA REPAIR N/A 10/02/2020   Procedure: LAPAROSCOPIC VENTRAL HERNIA REPAIR WITH MESH;  Surgeon:  Ralene Ok, MD;  Location: [place];  Service: General;  Laterality: N/A;    Home Medications:  Allergies as of 11/15/2022       Reactions   Penicillins Anaphylaxis, Swelling   Throat swelling        Medication List        Accurate as of November 15, 2022  3:26 PM. If you have any questions, ask your nurse or doctor.          albuterol 108 (90 Base) MCG/ACT inhaler Commonly known as: VENTOLIN HFA Inhale 2 puffs into the lungs every 6 (six) hours as needed for wheezing or shortness of breath.   atorvastatin 40 MG tablet Commonly known as: LIPITOR Take 1 tablet (40 mg total) by mouth daily.   fluticasone 50 MCG/ACT nasal spray Commonly known as: FLONASE Place 2 sprays into both nostrils daily as needed for allergies or rhinitis.   guaiFENesin 600 MG 12 hr tablet Commonly known as: MUCINEX Take 600 mg by mouth 2 (two) times daily as needed for cough or to loosen phlegm.   metoprolol tartrate 25 MG tablet Commonly known as: LOPRESSOR Take 1 tablet (25 mg total) by mouth 2 (two) times daily.   omeprazole 40  MG capsule Commonly known as: PRILOSEC Take 40 mg by mouth daily.   sodium chloride 0.65 % Soln nasal spray Commonly known as: OCEAN Place 1 spray into both nostrils daily as needed for congestion.   sulfamethoxazole-trimethoprim 800-160 MG tablet Commonly known as: BACTRIM DS Take 1 tablet by mouth 2 (two) times daily.   temazepam 15 MG capsule Commonly known as: RESTORIL Take 15 mg by mouth daily.        Allergies:  Allergies  Allergen Reactions   Penicillins Anaphylaxis and Swelling    Throat swelling    Family History: No family history on file.  Social History:  reports that he has never smoked. His smokeless tobacco use includes snuff. He reports that he does not currently use alcohol. He reports that he does not currently use drugs.   Physical Exam: BP (!) 162/99   Pulse 74   Ht 5\' 5"  (1.651 m)   Wt 190 lb (86.2 kg)   BMI 31.62  kg/m   Constitutional:  Alert and oriented, No acute distress. HEENT: [place] AT, moist mucus membranes.  Trachea midline, no masses. Cardiovascular: No clubbing, cyanosis, or edema. Respiratory: Normal respiratory effort, no increased work of breathing. GI: Abdomen is soft, nontender, nondistended, no abdominal masses GU: No CVA tenderness Lymph: No cervical or inguinal lymphadenopathy. Skin: No rashes, bruises or suspicious lesions. Neurologic: Grossly intact, no focal deficits, moving all 4 extremities. Psychiatric: Normal mood and affect. GU: Penis circumcised, normal foreskin, testicles descended bilaterally and palpably normal, small left hydrocele, bilateral epididymis palpably normal, scrotum normal DRE: Prostate 20 g, smooth without hard area or nodule   Laboratory Data: Lab Results  Component Value Date   WBC 5.7 10/19/2022   HGB 12.7 (L) 10/19/2022   HCT 37.2 (L) 10/19/2022   MCV 94.2 10/19/2022   PLT 174 10/19/2022    Lab Results  Component Value Date   CREATININE 1.10 10/19/2022    No results found for: "PSA"  No results found for: "TESTOSTERONE"  Lab Results  Component Value Date   HGBA1C 5.8 (H) 10/18/2022    Urinalysis No results found for: "COLORURINE", "APPEARANCEUR", "LABSPEC", "PHURINE", "GLUCOSEU", "HGBUR", "BILIRUBINUR", "KETONESUR", "PROTEINUR", "UROBILINOGEN", "NITRITE", "LEUKOCYTESUR"  No results found for: "LABMICR", "WBCUA", "RBCUA", "LABEPIT", "MUCUS", "BACTERIA"  Pertinent Imaging: Ct 2021    Assessment & Plan:    1. BPH - disc nature r/b/a to trial of tamsulosin. He would like to try.  - Urinalysis, Routine w reflex microscopic  2. Renal cyst - check renal US   3. Kidney stone - check KUB    No follow-ups on file.  Festus Aloe, MD  [facility]  [address] [place], [place] [address] [phone]

## 2022-11-24 ENCOUNTER — Ambulatory Visit: Payer: BC Managed Care – PPO

## 2022-11-24 ENCOUNTER — Telehealth: Payer: Self-pay

## 2022-11-24 ENCOUNTER — Ambulatory Visit (HOSPITAL_COMMUNITY)
Admission: RE | Admit: 2022-11-24 | Discharge: 2022-11-24 | Disposition: A | Discharge status: Home / Self Care | Payer: BC Managed Care – PPO | Source: Ambulatory Visit | Attending: Urology | Admitting: Urology

## 2022-11-24 DIAGNOSIS — N200 Calculus of kidney: Secondary | ICD-10-CM | POA: Insufficient documentation

## 2022-11-24 DIAGNOSIS — N281 Cyst of kidney, acquired: Secondary | ICD-10-CM | POA: Diagnosis present

## 2022-11-24 NOTE — Telephone Encounter (Signed)
Patient left a voice mail he has scheduling conflicts with his imaging schedule.  Sent to referral coordinator

## 2023-01-15 ENCOUNTER — Other Ambulatory Visit: Payer: Self-pay | Admitting: Cardiology

## 2023-01-17 NOTE — Telephone Encounter (Signed)
This is a [place] pt °

## 2023-05-10 ENCOUNTER — Other Ambulatory Visit: Payer: Self-pay | Admitting: Internal Medicine

## 2023-07-21 ENCOUNTER — Encounter: Payer: Self-pay | Admitting: Urology

## 2023-07-21 ENCOUNTER — Ambulatory Visit: Payer: BC Managed Care – PPO | Admitting: Urology

## 2023-07-21 VITALS — BP 120/84 | HR 112 | Temp 98.9°F

## 2023-07-21 DIAGNOSIS — N281 Cyst of kidney, acquired: Secondary | ICD-10-CM | POA: Diagnosis not present

## 2023-07-21 DIAGNOSIS — N492 Inflammatory disorders of scrotum: Secondary | ICD-10-CM

## 2023-07-21 DIAGNOSIS — N200 Calculus of kidney: Secondary | ICD-10-CM | POA: Diagnosis not present

## 2023-07-21 DIAGNOSIS — N5089 Other specified disorders of the male genital organs: Secondary | ICD-10-CM

## 2023-07-21 DIAGNOSIS — N452 Orchitis: Secondary | ICD-10-CM

## 2023-07-21 DIAGNOSIS — N401 Enlarged prostate with lower urinary tract symptoms: Secondary | ICD-10-CM | POA: Diagnosis not present

## 2023-07-21 DIAGNOSIS — R350 Frequency of micturition: Secondary | ICD-10-CM

## 2023-07-21 MED ORDER — SULFAMETHOXAZOLE-TRIMETHOPRIM 800-160 MG PO TABS: 1.0000 | ORAL_TABLET | Freq: Two times a day (BID) | ORAL | 0 refills | Status: AC

## 2023-07-21 NOTE — Progress Notes (Signed)
Name: Jerry Castillo DOB: 1966-07-25 MRN: [id number]  History of Present Illness: Jerry Castillo is a 57 y.o. male who presents today for return visit at [facility] [place]. - GU history: 1. BPH with LUTS (frequency, urgency, weak urinary stream). No prior PSA results found per chart review. 2. Kidney stones. 3. Complex left renal cyst. Per CT in 2021 (6.8 cm).  At last visit with Dr. Mena Goes on 11/15/2022: - Exam was noteworthy for small left hydrocele and 20 gram prostate.  - The plan was: 1. Start Flomax for BPH with LUTS. 2. RUS & KUB for renal stone / cyst surveillance.  Since last visit: > 11/24/2022:  - KUB showed bilateral kidney stones and constipation. - RUS showed:  1. Bilateral nephrolithiasis and nephrocalcinosis. 2. Small cyst on the right. Large loculated cystic lesion on the left. 3. Right kidney renal cortical scarring.  Today: He reports left scrotal pain, redness, and swelling since yesterday morning. Possibly related to a stick hitting his groin area while doing yard work. Reports feeling nauseous and somewhat dizzy; denies fevers.   He reports intermittent urinary stream. He denies intermittent urinary hesitancy and straining to void. Denies dysuria or gross hematuria.  Per chart review it appears he had a similar incident previously. Scrotal / testicular ultrasound on 06/20/2020 showed "No evidence of testicular mass or torsion is noted. Large left hydrocele is noted. Mild left varicocele is noted."   Fall Screening: Do you usually have a device to assist in your mobility? No   Medications: Current Outpatient Medications  Medication Sig Dispense Refill   albuterol (VENTOLIN HFA) 108 (90 Base) MCG/ACT inhaler Inhale 2 puffs into the lungs every 6 (six) hours as needed for wheezing or shortness of breath.     atorvastatin (LIPITOR) 40 MG tablet TAKE 1 TABLET BY MOUTH EVERY DAY 90 tablet 1   fluticasone (FLONASE) 50 MCG/ACT nasal spray Place 2  sprays into both nostrils daily as needed for allergies or rhinitis.     guaiFENesin (MUCINEX) 600 MG 12 hr tablet Take 600 mg by mouth 2 (two) times daily as needed for cough or to loosen phlegm.     metoprolol tartrate (LOPRESSOR) 25 MG tablet TAKE 1 TABLET BY MOUTH TWICE A DAY 180 tablet 1   omeprazole (PRILOSEC) 40 MG capsule Take 40 mg by mouth daily.     sodium chloride (OCEAN) 0.65 % SOLN nasal spray Place 1 spray into both nostrils daily as needed for congestion.     sulfamethoxazole-trimethoprim (BACTRIM DS) 800-160 MG tablet Take 1 tablet by mouth every 12 (twelve) hours. 14 tablet 0   tamsulosin (FLOMAX) 0.4 MG CAPS capsule Take 1 capsule (0.4 mg total) by mouth daily after supper. 90 capsule 3   temazepam (RESTORIL) 15 MG capsule Take 15 mg by mouth daily.     No current facility-administered medications for this visit.    Allergies: Allergies  Allergen Reactions   Penicillins Anaphylaxis and Swelling    Throat swelling    Past Medical History:  Diagnosis Date   GERD (gastroesophageal reflux disease)    Heart murmur    as a child   History of seasonal allergies    Hypertension    Pleurisy    Pneumonia    Scoliosis    Sleep apnea    uses a mouth guard, unable to tolerate CPAP   Past Surgical History:  Procedure Laterality Date   BACK SURGERY     x3 for scoliosis  HERNIA REPAIR     x2   INSERTION OF MESH N/A 10/02/2020   Procedure: INSERTION OF MESH;  Surgeon: Axel Filler, MD;  Location: [facility] OR;  Service: General;  Laterality: N/A;   LEFT HEART CATH AND CORONARY ANGIOGRAPHY N/A 10/18/2022   Procedure: LEFT HEART CATH AND CORONARY ANGIOGRAPHY;  Surgeon: Lennette Bihari, MD;  Location: [facility];  Service: Cardiovascular;  Laterality: N/A;   LEG SURGERY Right    repair - plates and screws   MANDIBLE FRACTURE SURGERY     NASAL SINUS SURGERY     VENTRAL HERNIA REPAIR  10/02/2020   VENTRAL HERNIA REPAIR N/A 10/02/2020   Procedure: LAPAROSCOPIC VENTRAL  HERNIA REPAIR WITH MESH;  Surgeon: Axel Filler, MD;  Location: [facility] OR;  Service: General;  Laterality: N/A;   History reviewed. No pertinent family history. Social History   Socioeconomic History   Marital status: Divorced    Spouse name: Not on file   Number of children: Not on file   Years of education: Not on file   Highest education level: Not on file  Occupational History   Not on file  Tobacco Use   Smoking status: Never   Smokeless tobacco: Current    Types: Snuff  Vaping Use   Vaping status: Never Used  Substance and Sexual Activity   Alcohol use: Not Currently   Drug use: Not Currently   Sexual activity: Not on file  Other Topics Concern   Not on file  Social History Narrative   Not on file   Social Determinants of Health   Financial Resource Strain: Not on file  Food Insecurity: Not on file  Transportation Needs: Not on file  Physical Activity: Not on file  Stress: Not on file  Social Connections: Not on file  Intimate Partner Violence: Not on file    Review of Systems Constitutional: Patient denies any unintentional weight loss or change in strength lntegumentary: Patient denies any rashes or pruritus Cardiovascular: Patient denies chest pain or syncope Respiratory: Patient denies shortness of breath Gastrointestinal: Patient denies vomiting, constipation, or diarrhea. Reports nausea. Musculoskeletal: Patient denies muscle cramps or weakness Neurologic: Patient denies convulsions or seizures Allergic/Immunologic: Patient denies recent allergic reaction(s) Hematologic/Lymphatic: Patient denies bleeding tendencies Endocrine: Patient denies heat/cold intolerance  GU: As per HPI.  OBJECTIVE Vitals:   07/21/23 1258  BP: 120/84  Pulse: (!) 112  Temp: 98.9 F (37.2 C)   There is no height or weight on file to calculate BMI.  Physical Examination Constitutional: No obvious distress; patient is non-toxic appearing  Cardiovascular: No visible  lower extremity edema.  Respiratory: The patient does not have audible wheezing/stridor; respirations do not appear labored  Gastrointestinal: Abdomen non-distended Musculoskeletal: Normal ROM of UEs  Skin: No obvious rashes/open sores  Neurologic: CN 2-12 grossly intact Psychiatric: Answered questions appropriately with normal affect  Hematologic/Lymphatic/Immunologic: No obvious bruises or sites of spontaneous bleeding  Genitourinary: Penis is normal in appearance.  Right hemiscrotum is normal in appearance. Left hemiscrotum is edematous with erythema, warmth, and tenderness to palpation.   Chaperone offered for pelvic exam; patient declined.   ASSESSMENT Orchitis of left testicle - Plan: sulfamethoxazole-trimethoprim (BACTRIM DS) 800-160 MG tablet  Scrotal swelling - Plan: sulfamethoxazole-trimethoprim (BACTRIM DS) 800-160 MG tablet  Cellulitis of scrotum - Plan: sulfamethoxazole-trimethoprim (BACTRIM DS) 800-160 MG tablet  Benign prostatic hyperplasia with urinary frequency  Kidney stones  Renal cyst  We discussed suspected left orchitis / scrotal cellulitis; will treat with Bactrim 2x/day for  7 days and plan for follow up in 1-2 weeks for recheck or sooner if needed.   Advised rest, application of ice, analgesics PRN, scrotal support throughout the day with supportive underwear and/or jock strap, and scrotal elevation when at rest for comfort and to promote drainage.  Pt verbalized understanding and agreement. All questions were answered.  PLAN Advised the following: 1. Bactrim 2x/day for 7 days. 2. Return in about 1 week (around 07/28/2023) for f/u with Evette Georges, NP for recheck of left orchitis / scrotal cellulitis.  No orders of the defined types were placed in this encounter.   It has been explained that the patient is to follow regularly with their PCP in addition to all other providers involved in their care and to follow instructions provided by these  respective offices. Patient advised to contact urology clinic if any urologic-pertaining questions, concerns, new symptoms or problems arise in the interim period.  There are no Patient Instructions on file for this visit.  Electronically signed by:  Donnita Falls, FNP   07/21/23    2:16 PM

## 2023-07-28 NOTE — Progress Notes (Addendum)
Name: Jerry Castillo DOB: [birth year] MRN: [id number]  History of Present Illness: Mr. Frappier is a 57 y.o. male who presents today for follow up visit at [facility] [place]. - GU history: 1. BPH with LUTS (frequency, urgency, weak urinary stream). - No prior PSA results found per chart review. - Taking Flomax 0.4 mg daily. 2. Kidney stones. - 11/24/2022: RUS showed "Innumerable echogenic foci consistent with known nephrolithiasis and nephrocalcinosis." 3. Complex left renal cyst.  - 11/24/2022: RUS showed "Septated cystic lesion lower pole measures approximately 11 x 10 x 9 cm."  4. Left hydrocele. Per scrotal / testicular ultrasound on 06/20/2020. 5. Left varicocele. Per scrotal / testicular ultrasound on 06/20/2020.  At last visit on 07/21/2023: - Seen for left orchitis / scrotal cellulitis. - The plan was Bactrim 2x/day for 7 days and plan for follow up in 1-2 weeks for recheck.  Since last visit: - 08/02/2023: Scrotal / testicular ultrasound performed; awaiting radiology read.  Today: He reports that his left scrotum improved with the antibiotic. Reports decreased swelling but it is still present. He denies scrotal redness, warmth, or significant tenderness. He denies fevers.  He denies any acute urinary complaints. Some persistent LUTS.   He is somewhat concerned about his renal cysts & kidney stones. He reports bilateral flank pain (L > R) but notes that it's difficult to differentiate from his chronic back pain.   He also reports that his libido has been low recently and is concerned about that.   Fall Screening: Do you usually have a device to assist in your mobility? No   Medications: Current Outpatient Medications  Medication Sig Dispense Refill   albuterol (VENTOLIN HFA) 108 (90 Base) MCG/ACT inhaler Inhale 2 puffs into the lungs every 6 (six) hours as needed for wheezing or shortness of breath.     atorvastatin (LIPITOR) 40 MG tablet TAKE 1 TABLET BY MOUTH  EVERY DAY 90 tablet 1   fluticasone (FLONASE) 50 MCG/ACT nasal spray Place 2 sprays into both nostrils daily as needed for allergies or rhinitis.     guaiFENesin (MUCINEX) 600 MG 12 hr tablet Take 600 mg by mouth 2 (two) times daily as needed for cough or to loosen phlegm.     metoprolol tartrate (LOPRESSOR) 25 MG tablet TAKE 1 TABLET BY MOUTH TWICE A DAY 180 tablet 1   omeprazole (PRILOSEC) 40 MG capsule Take 40 mg by mouth daily.     sodium chloride (OCEAN) 0.65 % SOLN nasal spray Place 1 spray into both nostrils daily as needed for congestion.     sulfamethoxazole-trimethoprim (BACTRIM DS) 800-160 MG tablet Take 1 tablet by mouth every 12 (twelve) hours. 14 tablet 0   tamsulosin (FLOMAX) 0.4 MG CAPS capsule Take 1 capsule (0.4 mg total) by mouth daily after supper. 90 capsule 3   temazepam (RESTORIL) 15 MG capsule Take 15 mg by mouth daily.     No current facility-administered medications for this visit.    Allergies: Allergies  Allergen Reactions   Penicillins Anaphylaxis and Swelling    Throat swelling    Past Medical History:  Diagnosis Date   GERD (gastroesophageal reflux disease)    Heart murmur    as a child   History of seasonal allergies    Hypertension    Pleurisy    Pneumonia    Scoliosis    Sleep apnea    uses a mouth guard, unable to tolerate CPAP   Past Surgical History:  Procedure Laterality Date  BACK SURGERY     x3 for scoliosis   HERNIA REPAIR     x2   INSERTION OF MESH N/A 10/02/2020   Procedure: INSERTION OF MESH;  Surgeon: Axel Filler, MD;  Location: [facility] OR;  Service: General;  Laterality: N/A;   LEFT HEART CATH AND CORONARY ANGIOGRAPHY N/A 10/18/2022   Procedure: LEFT HEART CATH AND CORONARY ANGIOGRAPHY;  Surgeon: Lennette Bihari, MD;  Location: [facility];  Service: Cardiovascular;  Laterality: N/A;   LEG SURGERY Right    repair - plates and screws   MANDIBLE FRACTURE SURGERY     NASAL SINUS SURGERY     VENTRAL HERNIA REPAIR   10/02/2020   VENTRAL HERNIA REPAIR N/A 10/02/2020   Procedure: LAPAROSCOPIC VENTRAL HERNIA REPAIR WITH MESH;  Surgeon: Axel Filler, MD;  Location: [facility] OR;  Service: General;  Laterality: N/A;   No family history on file. Social History   Socioeconomic History   Marital status: Divorced    Spouse name: Not on file   Number of children: Not on file   Years of education: Not on file   Highest education level: Not on file  Occupational History   Not on file  Tobacco Use   Smoking status: Never   Smokeless tobacco: Current    Types: Snuff  Vaping Use   Vaping status: Never Used  Substance and Sexual Activity   Alcohol use: Not Currently   Drug use: Not Currently   Sexual activity: Not on file  Other Topics Concern   Not on file  Social History Narrative   Not on file   Social Determinants of Health   Financial Resource Strain: Not on file  Food Insecurity: Not on file  Transportation Needs: Not on file  Physical Activity: Not on file  Stress: Not on file  Social Connections: Not on file  Intimate Partner Violence: Not on file   Review of Systems Constitutional: Patient denies any unintentional weight loss or change in strength lntegumentary: Patient denies any rashes or pruritus Cardiovascular: Patient denies chest pain or syncope Respiratory: Patient denies shortness of breath Gastrointestinal: Patient denies nausea, vomiting, constipation, or diarrhea Musculoskeletal: Patient denies muscle cramps or weakness Neurologic: Patient denies convulsions or seizures Allergic/Immunologic: Patient denies recent allergic reaction(s) Hematologic/Lymphatic: Patient denies bleeding tendencies Endocrine: Patient denies heat/cold intolerance  GU: As per HPI.  OBJECTIVE Vitals:   08/04/23 1351  BP: (!) 156/102  Pulse: 90  Temp: (!) 97.5 F (36.4 C)   There is no height or weight on file to calculate BMI.  Physical Examination Constitutional: No obvious distress;  patient is non-toxic appearing  Cardiovascular: No visible lower extremity edema.  Respiratory: The patient does not have audible wheezing/stridor; respirations do not appear labored  Gastrointestinal: Abdomen non-distended Musculoskeletal: Normal ROM of UEs  Skin: No obvious rashes/open sores  Neurologic: CN 2-12 grossly intact Psychiatric: Answered questions appropriately with normal affect  Hematologic/Lymphatic/Immunologic: No obvious bruises or sites of spontaneous bleeding  Genitourinary: Penis is normal in appearance.  Right hemiscrotum is normal in appearance. Left hemiscrotum is indurated with no fluctuance, erythema, warmth, rash, or tenderness to palpation.  UA: 6-10 WBC/hpf, 11-30 RBC/hpf, no bacteria  ASSESSMENT Orchitis of left testicle  Scrotal swelling  Cellulitis of scrotum  Benign prostatic hyperplasia with urinary frequency  Kidney stones  Renal cyst  Complex renal cyst - Plan: MR ABDOMEN W WO CONTRAST  Other specified disorders of kidney and ureter - Plan: MR ABDOMEN W WO CONTRAST  We discussed  increase in size of his complex left renal cyst, which measured 6 cm on CT in 2021 and more recently had grown to 11 cm per RUS in February 2024. Advised MRI for further evaluation.   His left orchitis appears to have resolved; no evidence of active infection per today's exam. He was advised that the remaining firmness in the left scrotum is thought to be induration which is expected to resolve over time. Advised rest, application of ice, analgesics PRN, scrotal support throughout the day with supportive underwear and/or jock strap, and scrotal elevation when at rest for comfort and to promote drainage. Still awaiting scrotal / testicular ultrasound radiology read; given patient's level of concern will consult Dr. Mena Goes for his impressions of that study and notify patient of his thoughts / recommendations.  Advised to continue Flomax 0.4 mg daily for BPH with LUTS.    He was advised that recent low libido may be related to his recent scrotal infection. Advised expectant management; may warrant testosterone testing in the future if that issue persists.   Will plan for follow up in 4 weeks with Dr. Mena Goes. Pt verbalized understanding and agreement. All questions were answered.  PLAN Advised the following: 1. Continue Flomax 0.4 mg daily. 2. Scrotal support and elevation. 3. OTC Analgesics PRN. 4. MRI abdomen. 5. Return in about 4 weeks (around 09/01/2023) for f/u with Dr. Mena Goes.  Orders Placed This Encounter  Procedures   MR ABDOMEN W WO CONTRAST    Standing Status:   Future    Standing Expiration Date:   08/03/2024    Order Specific Question:   If indicated for the ordered procedure, I authorize the administration of contrast media per Radiology protocol    Answer:   Yes    Order Specific Question:   What is the patient's sedation requirement?    Answer:   No Sedation    Order Specific Question:   Does the patient have a pacemaker or implanted devices?    Answer:   Yes    Order Specific Question:   Manufacturer of pacemake or implanted device?    Answer:   right lower extremity metal    Order Specific Question:   Preferred imaging location?    Answer:   [facility] (table limit [phone])    It has been explained that the patient is to follow regularly with their PCP in addition to all other providers involved in their care and to follow instructions provided by these respective offices. Patient advised to contact urology clinic if any urologic-pertaining questions, concerns, new symptoms or problems arise in the interim period.  There are no Patient Instructions on file for this visit.  Electronically signed by:  Donnita Falls, FNP   08/04/23    2:41 PM

## 2023-07-29 ENCOUNTER — Telehealth: Payer: Self-pay

## 2023-07-29 NOTE — Telephone Encounter (Signed)
Patient took last antibiotic yesterday. He continues to have the infection symptoms of pain and swelling.  With the weekend couming up, want s to know if he will need another round of antibiotic called into [facility]?  Please advise.  Please call to let him know before end of today.

## 2023-08-01 ENCOUNTER — Other Ambulatory Visit: Payer: Self-pay | Admitting: Urology

## 2023-08-01 DIAGNOSIS — N5089 Other specified disorders of the male genital organs: Secondary | ICD-10-CM

## 2023-08-01 NOTE — Telephone Encounter (Signed)
Please see below.

## 2023-08-01 NOTE — Telephone Encounter (Signed)
FYI patient is aware, he will call scheduling to have US done prior to appt.  He cannot come in on 10/16, he will keep his 10/17 appt on 2pm.

## 2023-08-02 ENCOUNTER — Ambulatory Visit (HOSPITAL_COMMUNITY)
Admission: RE | Admit: 2023-08-02 | Discharge: 2023-08-02 | Disposition: A | Discharge status: Home / Self Care | Payer: BC Managed Care – PPO | Source: Ambulatory Visit | Attending: Urology | Admitting: Urology

## 2023-08-02 DIAGNOSIS — N5089 Other specified disorders of the male genital organs: Secondary | ICD-10-CM | POA: Diagnosis present

## 2023-08-04 ENCOUNTER — Encounter: Payer: Self-pay | Admitting: Urology

## 2023-08-04 ENCOUNTER — Ambulatory Visit: Payer: BC Managed Care – PPO | Admitting: Urology

## 2023-08-04 VITALS — BP 156/102 | HR 90 | Temp 97.5°F

## 2023-08-04 DIAGNOSIS — N452 Orchitis: Secondary | ICD-10-CM

## 2023-08-04 DIAGNOSIS — R350 Frequency of micturition: Secondary | ICD-10-CM

## 2023-08-04 DIAGNOSIS — N2889 Other specified disorders of kidney and ureter: Secondary | ICD-10-CM

## 2023-08-04 DIAGNOSIS — N5089 Other specified disorders of the male genital organs: Secondary | ICD-10-CM | POA: Diagnosis not present

## 2023-08-04 DIAGNOSIS — N401 Enlarged prostate with lower urinary tract symptoms: Secondary | ICD-10-CM

## 2023-08-04 DIAGNOSIS — N200 Calculus of kidney: Secondary | ICD-10-CM

## 2023-08-04 DIAGNOSIS — N281 Cyst of kidney, acquired: Secondary | ICD-10-CM

## 2023-08-04 DIAGNOSIS — N492 Inflammatory disorders of scrotum: Secondary | ICD-10-CM

## 2023-08-04 NOTE — Addendum Note (Signed)
Addended by: Gustavus Messing on: 08/04/2023 03:03 PM   Modules accepted: Orders

## 2023-08-05 LAB — MICROSCOPIC EXAMINATION: Bacteria, UA: NONE SEEN

## 2023-08-05 LAB — URINALYSIS, ROUTINE W REFLEX MICROSCOPIC
Bilirubin, UA: NEGATIVE
Glucose, UA: NEGATIVE
Ketones, UA: NEGATIVE
Nitrite, UA: NEGATIVE
Specific Gravity, UA: 1.025 (ref 1.005–1.030)
Urobilinogen, Ur: 0.2 mg/dL (ref 0.2–1.0)
pH, UA: 6 (ref 5.0–7.5)

## 2023-08-06 LAB — URINE CULTURE: Organism ID, Bacteria: NO GROWTH

## 2023-08-08 ENCOUNTER — Telehealth: Payer: Self-pay

## 2023-08-08 NOTE — Telephone Encounter (Signed)
-----   Message from Donnita Falls sent at 08/08/2023  8:43 AM EDT ----- Please notify patient: Negative urine culture, no antibiotic needed at this time. Thanks.

## 2023-08-08 NOTE — Telephone Encounter (Signed)
Patient is made aware and voiced understanding. 

## 2023-08-22 ENCOUNTER — Ambulatory Visit: Payer: BC Managed Care – PPO | Admitting: Urology

## 2023-08-26 ENCOUNTER — Ambulatory Visit (HOSPITAL_COMMUNITY): Payer: BC Managed Care – PPO

## 2023-09-19 ENCOUNTER — Ambulatory Visit: Payer: BC Managed Care – PPO | Admitting: Urology

## 2023-09-19 VITALS — BP 143/84 | HR 83

## 2023-09-19 DIAGNOSIS — N452 Orchitis: Secondary | ICD-10-CM

## 2023-09-19 DIAGNOSIS — N138 Other obstructive and reflux uropathy: Secondary | ICD-10-CM

## 2023-09-19 DIAGNOSIS — R350 Frequency of micturition: Secondary | ICD-10-CM | POA: Diagnosis not present

## 2023-09-19 DIAGNOSIS — N200 Calculus of kidney: Secondary | ICD-10-CM

## 2023-09-19 DIAGNOSIS — N401 Enlarged prostate with lower urinary tract symptoms: Secondary | ICD-10-CM | POA: Diagnosis not present

## 2023-09-19 DIAGNOSIS — N281 Cyst of kidney, acquired: Secondary | ICD-10-CM

## 2023-09-19 DIAGNOSIS — N2889 Other specified disorders of kidney and ureter: Secondary | ICD-10-CM

## 2023-09-19 NOTE — Progress Notes (Unsigned)
09/19/2023 2:38 PM   Jerry Castillo 1966/05/01 [id number]  Referring provider: [facility] [address] [place],  [place] [address]  No chief complaint on file.   HPI: F/u -   1) frequency, urgency-noted in April 2023. He was treated with doxycycline and Bactrim.  A 2021 CT scan showed an unremarkable prostate.  His March 2023 PSA was 0.6. AUASS = 21. Weak st, freq and urgency. Noc x 2. He has OSA and uses a device/machine. No GU meds or surgery. No NG risk. Cr 1.27 in Jan 2024. No gross hematuria.    2) renal cyst-patient had a complex left lower pole cyst of 6.8 cm on CT in 2021. LLP cyst was 11 cm on Renal US in Feb 2024. Bilateral nephrocalcinosis was noted.    3) kidney stone-patient had a 15 mm left lower pole stone on CT in 2021. KUB equivocal in Feb 2024.    Today, seen for the above. He tried tamsulosin. LUTS improved. He developed a left E-O Oct 2024 with a red, tender and swollen left hemiscrotum. 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  He took abx. Swelling improved significantly and pain/redness resolved.   Addendum: his May 2024 PSA was 0.4.     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 MI in Dec 2023. No stent. No blood thinner. Maybe artery spasm.   PMH: Past Medical History:  Diagnosis Date   GERD (gastroesophageal reflux disease)    Heart murmur    as a child   History of seasonal allergies    Hypertension    Pleurisy    Pneumonia    Scoliosis    Sleep apnea    uses a mouth guard, unable to tolerate CPAP    Surgical History: Past Surgical History:  Procedure Laterality Date   BACK SURGERY     x3 for scoliosis   HERNIA REPAIR     x2   INSERTION OF MESH N/A 10/02/2020   Procedure: INSERTION OF MESH;  Surgeon: Axel Filler, MD;  Location[facility] OR;  Service: General;  Laterality: N/A;   LEFT HEART CATH AND CORONARY  ANGIOGRAPHY N/A 10/18/2022   Procedure: LEFT HEART CATH AND CORONARY ANGIOGRAPHY;  Surgeon: Lennette Bihari, MD;  Location: [facility] INVASIVE CV LAB;  Service: Cardiovascular;  Laterality: N/A;   LEG SURGERY Right    repair - plates and screws   MANDIBLE FRACTURE SURGERY     NASAL SINUS SURGERY     VENTRAL HERNIA REPAIR  10/02/2020   VENTRAL HERNIA REPAIR N/A 10/02/2020   Procedure: LAPAROSCOPIC VENTRAL HERNIA REPAIR WITH MESH;  Surgeon: Axel Filler, MD;  Location: [facility] OR;  Service: General;  Laterality: N/A;    Home Medications:  Allergies as of 09/19/2023       Reactions   Penicillins Anaphylaxis, Swelling   Throat swelling        Medication List        Accurate as of September 19, 2023  2:38 PM. If you have any questions, ask your nurse or doctor.          albuterol 108 (90 Base) MCG/ACT inhaler Commonly known as: VENTOLIN HFA Inhale 2 puffs into the lungs every 6 (six) hours as needed for wheezing or shortness of breath.   atorvastatin 40 MG tablet Commonly known as: LIPITOR TAKE 1 TABLET BY MOUTH EVERY DAY  fluticasone 50 MCG/ACT nasal spray Commonly known as: FLONASE Place 2 sprays into both nostrils daily as needed for allergies or rhinitis.   guaiFENesin 600 MG 12 hr tablet Commonly known as: MUCINEX Take 600 mg by mouth 2 (two) times daily as needed for cough or to loosen phlegm.   metoprolol tartrate 25 MG tablet Commonly known as: LOPRESSOR TAKE 1 TABLET BY MOUTH TWICE A DAY   omeprazole 40 MG capsule Commonly known as: PRILOSEC Take 40 mg by mouth daily.   sodium chloride 0.65 % Soln nasal spray Commonly known as: OCEAN Place 1 spray into both nostrils daily as needed for congestion.   sulfamethoxazole-trimethoprim 800-160 MG tablet Commonly known as: BACTRIM DS Take 1 tablet by mouth every 12 (twelve) hours.   tamsulosin 0.4 MG Caps capsule Commonly known as: FLOMAX Take 1 capsule (0.4 mg total) by mouth daily after supper.   temazepam 15 MG  capsule Commonly known as: RESTORIL Take 15 mg by mouth daily.        Allergies:  Allergies  Allergen Reactions   Penicillins Anaphylaxis and Swelling    Throat swelling    Family History: No family history on file.  Social History:  reports that he has never smoked. His smokeless tobacco use includes snuff. He reports that he does not currently use alcohol. He reports that he does not currently use drugs.   Physical Exam: There were no vitals taken for this visit.  Constitutional:  Alert and oriented, No acute distress. HEENT: [place] AT, moist mucus membranes.  Trachea midline, no masses. Cardiovascular: No clubbing, cyanosis, or edema. Respiratory: Normal respiratory effort, no increased work of breathing. GI: Abdomen is soft, nontender, nondistended, no abdominal masses GU: No CVA tenderness Lymph: No cervical or inguinal lymphadenopathy. Skin: No rashes, bruises or suspicious lesions. Neurologic: Grossly intact, no focal deficits, moving all 4 extremities. Psychiatric: Normal mood and affect. GU: Penis circumcised, normal foreskin, testicles descended bilaterally and palpably normal, bilateral epididymis palpably normal, 5 cm left hydrocele but testicle and epi are palbably nl. Scrotum normal   Laboratory Data: Lab Results  Component Value Date   WBC 5.7 10/19/2022   HGB 12.7 (L) 10/19/2022   HCT 37.2 (L) 10/19/2022   MCV 94.2 10/19/2022   PLT 174 10/19/2022    Lab Results  Component Value Date   CREATININE 1.10 10/19/2022    No results found for: "PSA"  No results found for: "TESTOSTERONE"  Lab Results  Component Value Date   HGBA1C 5.8 (H) 10/18/2022    Urinalysis    Component Value Date/Time   APPEARANCEUR Clear 08/04/2023 1505   GLUCOSEU Negative 08/04/2023 1505   BILIRUBINUR Negative 08/04/2023 1505   PROTEINUR Trace 08/04/2023 1505   NITRITE Negative 08/04/2023 1505   LEUKOCYTESUR Trace (A) 08/04/2023 1505    Lab Results  Component Value  Date   LABMICR See below: 08/04/2023   WBCUA 6-10 (A) 08/04/2023   LABEPIT 0-10 08/04/2023   MUCUS Present (A) 08/04/2023   BACTERIA None seen 08/04/2023    Pertinent Imaging:  Results for orders placed during the hospital encounter of 11/24/22  DG Abd 1 View  Narrative CLINICAL DATA:  Abdominal pain.  EXAM: ABDOMEN - 1 VIEW  COMPARISON:  07/07/2011  FINDINGS: The bowel gas pattern is normal without gaseous distention. Extensive nephrolithiasis known to be present is not well appreciated on plain film. A couple of 3-4 mm stones overlie the bilateral kidneys but are obscured by stool. Increased stool noted consistent with constipation.  IMPRESSION: Constipation. Known nephrolithiasis is barely perceptible and should be followed up with CT.   Electronically Signed By: Layla Maw M.D. On: 11/24/2022 22:25  No results found for this or any previous visit.  No results found for this or any previous visit.  No results found for this or any previous visit.  Results for orders placed during the hospital encounter of 11/24/22  US RENAL  Narrative CLINICAL DATA:  Nephrocalcinosis and cysts on CT from 2021.  EXAM: RENAL / URINARY TRACT ULTRASOUND COMPLETE  COMPARISON:  CT abdomen pelvis 07/30/2020  FINDINGS: Right Kidney:  Length: 11.3 cm. Cortical thinning and increased echogenicity consistent with chronic scarring. 1.8 cm cyst. Echogenic foci consistent with known nephrocalcinosis. No hydronephrosis.  Left Kidney:  Length: 14.7 cm. Septated cystic lesion lower pole measures approximately 11 x 10 x 9 cm. Innumerable echogenic foci consistent with known nephrolithiasis and nephrocalcinosis. No hydronephrosis.  Bladder:  Grossly unremarkable. Prevoid volume 360 mL. Bladder emptied completely.  Incidental note made of several shadowing gallstones.  IMPRESSION: 1. Bilateral nephrolithiasis and nephrocalcinosis. 2. Small cyst on the right. Large  loculated cystic lesion on the left. 3. Right kidney renal cortical scarring. 4. Incidental cholelithiasis.   Electronically Signed By: Layla Maw M.D. On: 11/24/2022 22:24  No valid procedures specified. No results found for this or any previous visit.  No results found for this or any previous visit.  Scrotal US images 2024 reviewed. CT images 2021 reviewed Assessment & Plan:    1) left E-O - resolved. Exam benign  2) bph, LUTS - cont tamsulosin. Recommended PSA test and he reports he had one at [facility]   3) renal cyst, 4 ) kidney stone - CT abdomen Jerry Castillo eval the cyst and the stones.   No follow-ups on file.  Jerilee Field, MD  [facility]  [address] [place], [place] [address] [phone]

## 2023-09-20 LAB — URINALYSIS, ROUTINE W REFLEX MICROSCOPIC
Bilirubin, UA: NEGATIVE
Glucose, UA: NEGATIVE
Ketones, UA: NEGATIVE
Nitrite, UA: NEGATIVE
Protein,UA: NEGATIVE
Specific Gravity, UA: 1.02 (ref 1.005–1.030)
Urobilinogen, Ur: 0.2 mg/dL (ref 0.2–1.0)
pH, UA: 6 (ref 5.0–7.5)

## 2023-09-20 LAB — MICROSCOPIC EXAMINATION

## 2023-09-23 ENCOUNTER — Other Ambulatory Visit: Payer: Self-pay

## 2023-09-23 DIAGNOSIS — N452 Orchitis: Secondary | ICD-10-CM

## 2023-09-23 MED ORDER — TAMSULOSIN HCL 0.4 MG PO CAPS: 0.4000 mg | ORAL_CAPSULE | Freq: Every day | ORAL | 0 refills | Status: DC

## 2023-09-23 NOTE — Progress Notes (Signed)
Rx refilled until next appt. 12/19/2023

## 2023-10-20 ENCOUNTER — Ambulatory Visit (HOSPITAL_COMMUNITY)
Admission: RE | Admit: 2023-10-20 | Discharge: 2023-10-20 | Disposition: A | Discharge status: Home / Self Care | Payer: BC Managed Care – PPO | Source: Ambulatory Visit | Attending: Urology | Admitting: Urology

## 2023-10-20 DIAGNOSIS — N2889 Other specified disorders of kidney and ureter: Secondary | ICD-10-CM

## 2023-10-24 ENCOUNTER — Other Ambulatory Visit: Payer: Self-pay | Admitting: Internal Medicine

## 2023-12-19 ENCOUNTER — Telehealth: Payer: Self-pay

## 2023-12-19 ENCOUNTER — Other Ambulatory Visit: Payer: Self-pay

## 2023-12-19 ENCOUNTER — Ambulatory Visit: Payer: BC Managed Care – PPO | Admitting: Urology

## 2023-12-19 DIAGNOSIS — N2889 Other specified disorders of kidney and ureter: Secondary | ICD-10-CM

## 2023-12-19 NOTE — Telephone Encounter (Signed)
 Called Pt reschedule appt as well as to ask if Pt got his CT completed prior to appt

## 2024-01-12 ENCOUNTER — Other Ambulatory Visit: Payer: Self-pay

## 2024-01-12 ENCOUNTER — Telehealth: Payer: Self-pay | Admitting: Urology

## 2024-01-12 DIAGNOSIS — N2889 Other specified disorders of kidney and ureter: Secondary | ICD-10-CM

## 2024-01-12 NOTE — Progress Notes (Signed)
 Opened in error

## 2024-01-12 NOTE — Telephone Encounter (Signed)
 Allergic to contrast, needs CT test without contrast. He has been twice and gets the wrong info, they say it is without and he gets there and they say it is with contrast.

## 2024-01-12 NOTE — Telephone Encounter (Signed)
 Do you want the CT w/ contrast ? If so do you want to send in the CT allergy protocol ? If not just do the CT w/o contrast ?

## 2024-01-13 NOTE — Telephone Encounter (Signed)
 Called Pt to let him know new order was put in to get his CT done w/o contrast Pt wants to know when Pt advised CT has to be authorized then someone will be in touch to schedule appt

## 2024-01-20 ENCOUNTER — Other Ambulatory Visit: Payer: Self-pay | Admitting: Internal Medicine

## 2024-01-21 ENCOUNTER — Other Ambulatory Visit: Payer: Self-pay | Admitting: Internal Medicine

## 2024-01-27 ENCOUNTER — Ambulatory Visit (HOSPITAL_COMMUNITY)

## 2024-02-06 ENCOUNTER — Other Ambulatory Visit: Payer: Self-pay

## 2024-02-06 DIAGNOSIS — N452 Orchitis: Secondary | ICD-10-CM

## 2024-02-06 MED ORDER — TAMSULOSIN HCL 0.4 MG PO CAPS: 0.4000 mg | ORAL_CAPSULE | Freq: Every day | ORAL | 2 refills | Status: AC

## 2024-02-25 ENCOUNTER — Other Ambulatory Visit: Payer: Self-pay | Admitting: Internal Medicine

## 2024-03-23 ENCOUNTER — Other Ambulatory Visit: Payer: Self-pay | Admitting: Internal Medicine

## 2024-04-06 ENCOUNTER — Ambulatory Visit (HOSPITAL_COMMUNITY)

## 2024-04-06 ENCOUNTER — Encounter (HOSPITAL_COMMUNITY): Payer: Self-pay

## 2024-04-12 ENCOUNTER — Other Ambulatory Visit: Payer: Self-pay | Admitting: Internal Medicine

## 2024-04-21 ENCOUNTER — Other Ambulatory Visit: Payer: Self-pay | Admitting: Internal Medicine

## 2024-05-10 ENCOUNTER — Other Ambulatory Visit: Payer: Self-pay | Admitting: Internal Medicine

## 2024-06-06 ENCOUNTER — Other Ambulatory Visit: Payer: Self-pay | Admitting: Internal Medicine

## 2024-06-15 ENCOUNTER — Ambulatory Visit (HOSPITAL_COMMUNITY)

## 2024-06-15 ENCOUNTER — Encounter (HOSPITAL_COMMUNITY): Payer: Self-pay

## 2024-06-16 ENCOUNTER — Other Ambulatory Visit: Payer: Self-pay | Admitting: Internal Medicine

## 2024-06-19 NOTE — Telephone Encounter (Signed)
 Pt of Dr. Mallipeddi. Past his 3rd attempt. Does Dr. Mallipeddi want to refill? Please advise.

## 2024-06-24 ENCOUNTER — Other Ambulatory Visit: Payer: Self-pay | Admitting: Internal Medicine

## 2024-07-20 ENCOUNTER — Other Ambulatory Visit: Payer: Self-pay | Admitting: Internal Medicine

## 2024-07-23 ENCOUNTER — Other Ambulatory Visit: Payer: Self-pay | Admitting: Internal Medicine

## 2024-07-25 ENCOUNTER — Telehealth: Payer: Self-pay | Admitting: Internal Medicine

## 2024-07-25 MED ORDER — METOPROLOL TARTRATE 25 MG PO TABS: 25.0000 mg | ORAL_TABLET | Freq: Two times a day (BID) | ORAL | 0 refills | Status: DC

## 2024-07-25 NOTE — Telephone Encounter (Signed)
 RX sent in

## 2024-07-25 NOTE — Telephone Encounter (Signed)
 *  STAT* If patient is at the pharmacy, call can be transferred to refill team.   1. Which medications need to be refilled? (please list name of each medication and dose if known) metoprolol  tartrate (LOPRESSOR ) 25 MG tablet    2. Would you like to learn more about the convenience, safety, & potential cost savings by using the [facility]? No      3. Are you open to using the [facility] (Type [facility]. ). No    4. Which pharmacy/location (including street and city if local pharmacy) is medication to be sent to? [id number] - [place], [place] - [address]    5. Do they need a 30 day or 90 day supply? 30 days     Patient is out of meds need refill today. Patient made an appt with Hao Meng on 08/03/24 at 8:50 am

## 2024-07-25 NOTE — Telephone Encounter (Signed)
 Pt of Dr. Mallipeddi. Passed his 3rd attempt. Does Dr. Mallipeddi want to refill? Please advise.

## 2024-08-03 ENCOUNTER — Ambulatory Visit: Attending: Physician Assistant | Admitting: Physician Assistant

## 2024-08-03 ENCOUNTER — Encounter: Payer: Self-pay | Admitting: Physician Assistant

## 2024-08-03 VITALS — BP 140/84 | HR 106 | Ht 65.0 in | Wt 187.0 lb

## 2024-08-03 DIAGNOSIS — I10 Essential (primary) hypertension: Secondary | ICD-10-CM

## 2024-08-03 DIAGNOSIS — E7800 Pure hypercholesterolemia, unspecified: Secondary | ICD-10-CM

## 2024-08-03 DIAGNOSIS — E785 Hyperlipidemia, unspecified: Secondary | ICD-10-CM

## 2024-08-03 DIAGNOSIS — R600 Localized edema: Secondary | ICD-10-CM

## 2024-08-03 DIAGNOSIS — R9431 Abnormal electrocardiogram [ECG] [EKG]: Secondary | ICD-10-CM

## 2024-08-03 NOTE — Progress Notes (Unsigned)
 Cardiology Office Note   Date:  08/05/2024  ID:  Jerry Castillo., DOB 09/14/1966, MRN [id number] PCP: [facility]  [facility] Providers Cardiologist:  Jerry FORBES Calender, MD     History of Present Illness Jerry Castillo. is a 58 y.o. male with past medical history of EtOH abuse, elevated LFT, hypertension, hyperlipidemia and history of abnormal EKG.  Patient was previously presented to behavioral health in January 2024 with plans for alcohol detox however noted to have abnormal EKG and concern for STEMI.  Code STEMI was activated and patient was taken to the Cath Lab on 10/18/2022 that showed normal coronary arteries.  Echocardiogram obtained at the time showed EF 60 to 65%, no regional wall motion abnormality, grade 1 DD, no significant valve issue.  Hospital course was complicated by elevated liver enzyme that trended down.  He was last seen by Jerry Crate NP in January 2024 at which time he was doing well.  He was instructed to follow-up with Dr. Mallipeddi in 3 to 4 months, but did not follow up until now.  He presents today for delayed follow-up.  he is says he has been under a lot of stress at work.  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  He wished to establish with a cardiologist in [place] area which is 1 hour from where he lives right now.  He denies any chest pain.  He had a viral infection few months back, however his lung is clear today.  He has blood pressure is borderline elevated, however he says his blood pressure is typically running in the 120s at home and is only high in the doctor's office.  I asked him to continue on the current therapy.  It appears his PCP may have prescribed him Lasix 40 mg daily.  He does have leg edema but it sounds more like venous stasis that resolved in the morning and worse by the end of the day.  He is on his  feet about 50% time at work.  He work 10 to 14-hour shift.  He is overdue for CMET, magnesium , CBC and fasting lipid panel.  He is not fasting today.  This can be done at his local [facility].  I will set him up with Dr. Calender in 6 to 8 months.  ROS:   He denies chest pain, palpitations, dyspnea, pnd, orthopnea, n, v, dizziness, syncope, weight gain, or early satiety. All other systems reviewed and are otherwise negative except as noted above.    Studies Reviewed      Cardiac Studies & Procedures   ______________________________________________________________________________________________ CARDIAC CATHETERIZATION  CARDIAC CATHETERIZATION 10/18/2022  Conclusion   The left ventricular systolic function is normal.   LV end diastolic pressure is normal.  Normal epicardial coronary arteries with right dominant coronary anatomy.  Normal LV systolic function with estimated EF approximately 55%.  LVEDP 13 mmHg.  RECOMMENDATION: Patient will be admitted to laboratory.  Obtain serial troponin.  He has been on lisinopril  blood pressure control.  Will need to monitor LFTs which are elevated most likely due to recent excessive EtOH use.  Will obtain 2D echo Doppler study in a.m.  Lipid studies are elevated but at present defer statin therapy with elevated LFTs.  Findings Coronary Findings Diagnostic  Dominance: Right  No diagnostic findings have been documented. Intervention  No interventions  have been documented.     ECHOCARDIOGRAM  ECHOCARDIOGRAM COMPLETE 10/19/2022  Narrative ECHOCARDIOGRAM REPORT    Patient Name:   Jerry Castillo Date of Exam: 10/19/2022 Medical Rec #:  [id number]       Height:       65.0 in Accession #:    [id number]      Weight:       179.1 lb Date of Birth:  [birth year]       BSA:          1.887 m Patient Age:    56 years        BP:           158/87 mmHg Patient Gender: M               HR:           78 bpm. Exam Location:  Inpatient  Procedure: 2D Echo,  Cardiac Doppler, Color Doppler, 3D Echo and Strain Analysis  Indications:    Acute myocardial infarction, unspecified I21.9  History:        Patient has no prior history of Echocardiogram examinations. Arrythmias:RBBB, Signs/Symptoms:Murmur; Risk Factors:Hypertension and Sleep Apnea. GERD.  Sonographer:    Annabella Fell RDCS Referring Phys: [id number] LAURA R INGOLD  IMPRESSIONS   1. Left ventricular ejection fraction, by estimation, is 60 to 65%. Left ventricular ejection fraction by PLAX is 65 %. The left ventricle has normal function. The left ventricle has no regional wall motion abnormalities. There is mild concentric left ventricular hypertrophy of the basal-septal segment. Left ventricular diastolic parameters are consistent with Grade I diastolic dysfunction (impaired relaxation). The average left ventricular global longitudinal strain is -17.2 %. The global longitudinal strain is abnormal. 2. Right ventricular systolic function is normal. The right ventricular size is normal. Tricuspid regurgitation signal is inadequate for assessing PA pressure. 3. The mitral valve is myxomatous. No evidence of mitral valve regurgitation. No evidence of mitral stenosis. 4. The aortic valve is tricuspid. Aortic valve regurgitation is trivial. No aortic stenosis is present. 5. The inferior vena cava is normal in size with greater than 50% respiratory variability, suggesting right atrial pressure of 3 mmHg.  FINDINGS Left Ventricle: Left ventricular ejection fraction, by estimation, is 60 to 65%. Left ventricular ejection fraction by PLAX is 65 %. The left ventricle has normal function. The left ventricle has no regional wall motion abnormalities. The average left ventricular global longitudinal strain is -17.2 %. The global longitudinal strain is abnormal. The left ventricular internal cavity size was normal in size. There is mild concentric left ventricular hypertrophy of the basal-septal segment.  Left ventricular diastolic parameters are consistent with Grade I diastolic dysfunction (impaired relaxation). Indeterminate filling pressures.  Right Ventricle: The right ventricular size is normal. No increase in right ventricular wall thickness. Right ventricular systolic function is normal. Tricuspid regurgitation signal is inadequate for assessing PA pressure.  Left Atrium: Left atrial size was normal in size.  Right Atrium: Right atrial size was normal in size.  Pericardium: There is no evidence of pericardial effusion.  Mitral Valve: The mitral valve is myxomatous. No evidence of mitral valve regurgitation. No evidence of mitral valve stenosis.  Tricuspid Valve: The tricuspid valve is grossly normal. Tricuspid valve regurgitation is not demonstrated. No evidence of tricuspid stenosis.  Aortic Valve: The aortic valve is tricuspid. Aortic valve regurgitation is trivial. No aortic stenosis is present.  Pulmonic Valve: The pulmonic valve was grossly normal. Pulmonic valve regurgitation is mild. No evidence of  pulmonic stenosis.  Aorta: The aortic root and ascending aorta are structurally normal, with no evidence of dilitation.  Venous: The inferior vena cava is normal in size with greater than 50% respiratory variability, suggesting right atrial pressure of 3 mmHg.  IAS/Shunts: No atrial level shunt detected by color flow Doppler.   LEFT VENTRICLE PLAX 2D LV EF:         Left            Diastology ventricular     LV e' medial:    5.08 cm/s ejection        LV E/e' medial:  12.9 fraction by     LV e' lateral:   7.04 cm/s PLAX is 65      LV E/e' lateral: 9.3 %. LVIDd:         4.00 cm         2D LVIDs:         2.60 cm         Longitudinal LV PW:         1.00 cm         Strain LV IVS:        1.20 cm         2D Strain GLS  -19.7 % LVOT diam:     2.10 cm         (A2C): LV SV:         71              2D Strain GLS  -14.4 % LV SV Index:   37              (A3C): LVOT Area:     3.46  cm        2D Strain GLS  -17.6 % (A4C): 2D Strain GLS  -17.2 % Avg:  RIGHT VENTRICLE RV S prime:     13.10 cm/s TAPSE (M-mode): 1.9 cm  LEFT ATRIUM             Index        RIGHT ATRIUM          Index LA diam:        2.00 cm 1.06 cm/m   RA Area:     8.91 cm LA Vol (A2C):   54.5 ml 28.87 ml/m  RA Volume:   13.30 ml 7.05 ml/m LA Vol (A4C):   47.8 ml 25.32 ml/m LA Biplane Vol: 51.6 ml 27.34 ml/m AORTIC VALVE             PULMONIC VALVE LVOT Vmax:   115.00 cm/s PR End Diast Vel: 1.91 msec LVOT Vmean:  80.500 cm/s LVOT VTI:    0.204 m  AORTA Ao Root diam: 3.30 cm Ao STJ diam:  2.8 cm Ao Asc diam:  3.10 cm  MITRAL VALVE MV Area (PHT): 2.76 cm    SHUNTS MV Decel Time: 275 msec    Systemic VTI:  0.20 m MV E velocity: 65.60 cm/s  Systemic Diam: 2.10 cm MV A velocity: 64.30 cm/s MV E/A ratio:  1.02  Mihai Croitoru MD Electronically signed by Jerel Balding MD Signature Date/Time: 10/19/2022/9:25:11 AM    Final          ______________________________________________________________________________________________      Risk Assessment/Calculations          Physical Exam VS:  BP (!) 140/84   Pulse (!) 106   Ht 5' 5 (1.651 m)   Wt 187 lb (84.8 kg)  SpO2 98%   BMI 31.12 kg/m        Wt Readings from Last 3 Encounters:  08/03/24 187 lb (84.8 kg)  11/15/22 190 lb (86.2 kg)  10/29/22 195 lb 6.4 oz (88.6 kg)    GEN: Well nourished, well developed in no acute distress NECK: No JVD; No carotid bruits CARDIAC: RRR, no murmurs, rubs, gallops RESPIRATORY:  Clear to auscultation without rales, wheezing or rhonchi  ABDOMEN: Soft, non-tender, non-distended EXTREMITIES:  No edema; No deformity   ASSESSMENT AND PLAN  Abnormal EKG: Previous cardiac catheterization in 2024 showed normal coronary arteries.  Hypertension: Blood pressure elevated in the office, however normally in the 120s at home.  I decided to hold off on adjusting blood pressure medication.  I did  recommend he bring with him his home blood pressure cuff in the future so we can calibrate it against a wall we have in the office.  Obtain annual blood work including CMET, CBC, magnesium  and a fasting lipid panel  Hyperlipidemia: On atorvastatin  40 mg daily.  Obtain fasting lipid panel  Leg edema: He describes leg edema that is better in the morning and worse at night.  Symptom is consistent with venous stasis.  Recommend leg elevation.  According to the patient, his PCP has prescribed Lasix 40 mg daily.  Will obtain CMET to make sure renal function is stable.       Dispo: He previously followed by Dr. Vanda of [place], however has since moved to [place]  due to work requirement.  [facility]  is about 1.5 hours away from [place] but only 1 hour away from here.  He wished to be followed by our [facility] office.  I plan to set him up with Dr. Reeta in 6 to 8 months.  Signed, Scot Ford, PA

## 2024-08-03 NOTE — Patient Instructions (Signed)
 Medication Instructions:  Your physician recommends that you continue on your current medications as directed. Please refer to the Current Medication list given to you today.  *If you need a refill on your cardiac medications before your next appointment, please call your pharmacy*  Lab Work: Fastiing Lipids, CMET, CBC, MG2 If you have labs (blood work) drawn today and your tests are completely normal, you will receive your results only by: MyChart Message (if you have MyChart) OR A paper copy in the mail If you have any lab test that is abnormal or we need to change your treatment, we will call you to review the results.    Follow-Up: At [facility], you and your health needs are our priority.  As part of our continuing mission to provide you with exceptional heart care, our providers are all part of one team.  This team includes your primary Cardiologist (physician) and Advanced Practice Providers or APPs (Physician Assistants and Nurse Practitioners) who all work together to provide you with the care you need, when you need it.  Your next appointment:   6-8 month(s)  Provider:   Emeline FORBES Calender, MD

## 2024-08-16 ENCOUNTER — Other Ambulatory Visit: Payer: Self-pay | Admitting: Internal Medicine

## 2024-11-06 ENCOUNTER — Other Ambulatory Visit: Payer: Self-pay

## 2024-11-06 DIAGNOSIS — N452 Orchitis: Secondary | ICD-10-CM

## 2024-11-06 NOTE — Telephone Encounter (Signed)
 Jerry Castillo

## 2024-11-14 ENCOUNTER — Other Ambulatory Visit: Payer: Self-pay

## 2024-11-14 DIAGNOSIS — N452 Orchitis: Secondary | ICD-10-CM

## 2024-11-14 NOTE — Telephone Encounter (Signed)
 Patient called to get refill of medication he stated typically [facility] refills the prescription but he has had to relocate due to work and no longer has PCP due to [facility] closing patient also stated he cancelled his original follow up appointment due to cancer diagnosis and could not make it patient just needs enough flomax  to get to his next appointment

## 2024-11-14 NOTE — Telephone Encounter (Signed)
 Return call to pt and state's that he need a refill on Flomax .

## 2025-05-20 ENCOUNTER — Ambulatory Visit: Admitting: Urology
